# Patient Record
Sex: Female | Born: 1970 | Race: Black or African American | Hispanic: No | Smoking: Never smoker
Health system: Southern US, Community
[De-identification: ages and names within clinical notes are randomized; demographics above are authoritative.]

## PROBLEM LIST (undated history)

## (undated) ENCOUNTER — Emergency Department: Discharge status: Absent without leave | Payer: Self-pay

## (undated) DIAGNOSIS — R519 Headache, unspecified: Secondary | ICD-10-CM

## (undated) DIAGNOSIS — K219 Gastro-esophageal reflux disease without esophagitis: Secondary | ICD-10-CM

## (undated) DIAGNOSIS — R51 Headache: Secondary | ICD-10-CM

## (undated) DIAGNOSIS — R079 Chest pain, unspecified: Secondary | ICD-10-CM

## (undated) DIAGNOSIS — F419 Anxiety disorder, unspecified: Secondary | ICD-10-CM

## (undated) HISTORY — DX: Anxiety disorder, unspecified: F41.9

## (undated) HISTORY — PX: OTHER SURGICAL HISTORY: SHX169

---

## 2004-09-01 ENCOUNTER — Emergency Department: Payer: Self-pay | Admitting: Emergency Medicine

## 2005-02-14 ENCOUNTER — Emergency Department: Payer: Self-pay | Admitting: Emergency Medicine

## 2005-02-15 ENCOUNTER — Emergency Department: Payer: Self-pay | Admitting: Emergency Medicine

## 2006-12-21 ENCOUNTER — Encounter: Payer: Self-pay | Admitting: Maternal & Fetal Medicine

## 2007-02-22 ENCOUNTER — Encounter: Payer: Self-pay | Admitting: Maternal & Fetal Medicine

## 2007-03-22 ENCOUNTER — Encounter: Payer: Self-pay | Admitting: Maternal & Fetal Medicine

## 2010-08-28 ENCOUNTER — Ambulatory Visit: Payer: Self-pay | Admitting: Family Medicine

## 2010-11-03 HISTORY — PX: BREAST BIOPSY: SHX20

## 2012-11-16 ENCOUNTER — Observation Stay: Payer: Self-pay | Admitting: Internal Medicine

## 2012-11-16 LAB — CBC
HCT: 38.1 % (ref 35.0–47.0)
HGB: 12.8 g/dL (ref 12.0–16.0)
MCH: 31 pg (ref 26.0–34.0)
MCV: 92 fL (ref 80–100)
Platelet: 284 10*3/uL (ref 150–440)
RBC: 4.12 10*6/uL (ref 3.80–5.20)
RDW: 12.9 % (ref 11.5–14.5)
WBC: 6 10*3/uL (ref 3.6–11.0)

## 2012-11-16 LAB — COMPREHENSIVE METABOLIC PANEL
Albumin: 3.6 g/dL (ref 3.4–5.0)
Anion Gap: 8 (ref 7–16)
BUN: 10 mg/dL (ref 7–18)
Calcium, Total: 8.5 mg/dL (ref 8.5–10.1)
Creatinine: 0.68 mg/dL (ref 0.60–1.30)
EGFR (Non-African Amer.): >60
Glucose: 76 mg/dL (ref 65–99)
Osmolality: 273 (ref 275–301)
Potassium: 3.7 mmol/L (ref 3.5–5.1)
SGOT(AST): 38 U/L — ABNORMAL HIGH (ref 15–37)
SGPT (ALT): 68 U/L (ref 12–78)
Sodium: 138 mmol/L (ref 136–145)
Total Protein: 7.9 g/dL (ref 6.4–8.2)

## 2012-11-16 LAB — CK TOTAL AND CKMB (NOT AT ARMC)
CK, Total: 68 U/L (ref 21–215)
CK, Total: 51 U/L (ref 21–215)

## 2012-11-16 LAB — TROPONIN I
Troponin-I: <0.02 ng/mL
Troponin-I: <0.02 ng/mL

## 2012-11-16 LAB — URINALYSIS, COMPLETE
Blood: NEGATIVE
Leukocyte Esterase: NEGATIVE
Nitrite: NEGATIVE
RBC,UR: 2 /HPF (ref 0–5)
WBC UR: 3 /HPF (ref 0–5)

## 2012-11-16 LAB — HCG, QUANTITATIVE, PREGNANCY: Beta Hcg, Quant.: <1 m[IU]/mL — ABNORMAL LOW

## 2012-11-17 DIAGNOSIS — R079 Chest pain, unspecified: Secondary | ICD-10-CM

## 2012-11-17 LAB — LIPID PANEL
Cholesterol: 114 mg/dL (ref 0–200)
Ldl Cholesterol, Calc: 55 mg/dL (ref 0–100)
Triglycerides: 85 mg/dL (ref 0–200)
VLDL Cholesterol, Calc: 17 mg/dL (ref 5–40)

## 2012-11-17 LAB — TROPONIN I: Troponin-I: <0.02 ng/mL

## 2012-11-17 LAB — CK TOTAL AND CKMB (NOT AT ARMC): CK-MB: <0.5 ng/mL — ABNORMAL LOW (ref 0.5–3.6)

## 2012-12-14 ENCOUNTER — Ambulatory Visit: Payer: Self-pay | Admitting: Family Medicine

## 2013-11-03 HISTORY — PX: BREAST EXCISIONAL BIOPSY: SUR124

## 2014-06-15 ENCOUNTER — Ambulatory Visit: Payer: Self-pay | Admitting: Physician Assistant

## 2014-06-26 ENCOUNTER — Ambulatory Visit: Payer: Self-pay | Admitting: Physician Assistant

## 2014-07-13 ENCOUNTER — Encounter: Payer: Self-pay | Admitting: General Surgery

## 2014-07-13 ENCOUNTER — Ambulatory Visit (INDEPENDENT_AMBULATORY_CARE_PROVIDER_SITE_OTHER): Payer: Managed Care, Other (non HMO) | Admitting: General Surgery

## 2014-07-13 VITALS — BP 132/80 | HR 86 | Resp 12 | Ht 59.0 in | Wt 166.0 lb

## 2014-07-13 DIAGNOSIS — N63 Unspecified lump in unspecified breast: Secondary | ICD-10-CM

## 2014-07-13 NOTE — Patient Instructions (Addendum)
Patient to be scheduled for outpatient surgery. The patient is aware to call back for any questions or concerns.  This patient's surgery has been scheduled for 07-26-14 at [facility].

## 2014-07-13 NOTE — Progress Notes (Signed)
Patient ID: [name], female   DOB: September 01, 1971, 43 y.o.   MRN: [id number]  Chief Complaint  Patient presents with  . Other    abnormal mammogram    HPI [name] is a 43 y.o. female who presents for a breast evaluation. The most recent mammogram was done on 06/26/14. Patient does perform regular self breast checks. She denies any new problems with the breasts at this time. In 2012 she had core biopsy of a small left breast mass. Pathology showed benign adipose and breast tissue. Follow up was recommended then but patient did not return.    HPI  Past Medical History  Diagnosis Date  . Anxiety     Past Surgical History  Procedure Laterality Date  . Cesarean section  [id number]  . Breast biopsy Left 2012    Family History  Problem Relation Age of Onset  . Cancer Cousin     breast - 2nd cousin    Social History History  Substance Use Topics  . Smoking status: Never Smoker   . Smokeless tobacco: Never Used  . Alcohol Use: Yes    No Known Allergies  Current Outpatient Prescriptions  Medication Sig Dispense Refill  . ALYACEN 7/7/7 0.5/0.75/1-35 MG-MCG tablet Take 1 tablet by mouth daily.      . clonazePAM (KLONOPIN) 0.5 MG tablet Take 0.5 mg by mouth 2 (two) times daily as needed for anxiety.      Marland Kitchen ibuprofen (ADVIL,MOTRIN) 200 MG tablet Take 200 mg by mouth every 6 (six) hours as needed.       No current facility-administered medications for this visit.    Review of Systems Review of Systems  Constitutional: Negative.   Respiratory: Negative.   Cardiovascular: Negative.     Blood pressure 132/80, pulse 86, resp. rate 12, height 4\' 11"  (1.499 m), weight 166 lb (75.297 kg), last menstrual period 06/13/2014.  Physical Exam Physical Exam  Constitutional: She is oriented to person, place, and time. She appears well-developed and well-nourished.  Eyes: Conjunctivae are normal. No scleral icterus.  Neck: Neck supple. No thyromegaly present.  Cardiovascular:  Normal rate, regular rhythm and normal heart sounds.   No murmur heard. Pulmonary/Chest: Effort normal and breath sounds normal. Right breast exhibits no inverted nipple, no mass, no nipple discharge, no skin change and no tenderness. Left breast exhibits mass. Left breast exhibits no inverted nipple, no nipple discharge, no skin change and no tenderness.  Left breast 2 cm smooth mass located 3 o'clock just outside of areolar margin.   Lymphadenopathy:    She has no cervical adenopathy.    She has no axillary adenopathy.  Neurological: She is alert and oriented to person, place, and time.  Skin: Skin is warm and dry.    Data Reviewed Mammogram reviewed. Nodule is seen left breast 3 o'clock with clip in place. Ultrasound shows a well defined mass now measuring 1.9 cm. Previous measurement was 0.85 cm.   Assessment    Enlarging left breast nodule. Recommended excision. Discussed fully with the patient and she is agreeable.     Plan    Excision left breast mass in same day surgery.     Patient's surgery has been scheduled for 07-26-14 at [facility].   [name] 07/13/2014, 1:43 PM

## 2014-07-17 ENCOUNTER — Other Ambulatory Visit: Payer: Self-pay | Admitting: General Surgery

## 2014-07-17 DIAGNOSIS — N63 Unspecified lump in unspecified breast: Secondary | ICD-10-CM

## 2014-07-25 ENCOUNTER — Telehealth: Payer: Self-pay | Admitting: *Deleted

## 2014-07-25 NOTE — Telephone Encounter (Signed)
Pt called our office this morning stating that she is to have surgery tomorrow and was concerned about getting a flu shot today. I spoke with Dr. Jamal Collin about pt concern and he advised that as long as she has not had any reactions in the past where she was sick for 2-3 days then it would not cause a problem with having surgery tomorrow as scheduled. I spoke with and explained this to her. She verbalized her understanding but was still unsure about taking the flu shot. I suggested that if it was not necessary to have the shot today and she would feel more comfortable waiting then she had that option also. She verbalized understanding of these options and thanked me.

## 2014-07-26 ENCOUNTER — Ambulatory Visit: Payer: Self-pay | Admitting: General Surgery

## 2014-07-26 DIAGNOSIS — D249 Benign neoplasm of unspecified breast: Secondary | ICD-10-CM

## 2014-07-27 LAB — PATHOLOGY REPORT

## 2014-07-28 ENCOUNTER — Encounter: Payer: Self-pay | Admitting: General Surgery

## 2014-07-31 ENCOUNTER — Telehealth: Payer: Self-pay | Admitting: *Deleted

## 2014-07-31 ENCOUNTER — Encounter: Payer: Self-pay | Admitting: General Surgery

## 2014-07-31 NOTE — Telephone Encounter (Signed)
Patient was contacted today to make her aware that pathology report came back okay. She was instructed to keep follow up appointment as scheduled for 08-07-14. This patient verbalizes understanding.

## 2014-08-07 ENCOUNTER — Ambulatory Visit (INDEPENDENT_AMBULATORY_CARE_PROVIDER_SITE_OTHER): Payer: Self-pay | Admitting: General Surgery

## 2014-08-07 ENCOUNTER — Encounter: Payer: Self-pay | Admitting: General Surgery

## 2014-08-07 VITALS — BP 128/74 | HR 78 | Resp 12 | Ht 59.0 in | Wt 167.0 lb

## 2014-08-07 DIAGNOSIS — D242 Benign neoplasm of left breast: Secondary | ICD-10-CM

## 2014-08-07 NOTE — Progress Notes (Signed)
This is a 43 year old female here today for her post op left breast mass excision done on 07/31/14. Patient states she is doing well.  Incision looks clean and healing well.  Path- fibroadenoma. Pt advised. Patient to return in one year bilateral screening mammogram.

## 2014-08-07 NOTE — Patient Instructions (Signed)
Patient to return in one year bilateral screening mammogram. 

## 2014-09-04 ENCOUNTER — Encounter: Payer: Self-pay | Admitting: General Surgery

## 2015-02-23 NOTE — Discharge Summary (Signed)
PATIENT NAME:  [name], [name] MR#:  [id number] DATE OF BIRTH:  23-Dec-1970  DATE OF ADMISSION:  11/16/2012 DATE OF DISCHARGE:  11/17/2012  PRIMARY CARE PHYSICIAN: Rodman Key, MD    CHIEF COMPLAINT: Chest pain.   DISCHARGE DIAGNOSES: 1. Chest pain, appears noncardiac.  2. Syncopal episode without injury.  3. Anxiety, stress.  4. Myoview stress test negative.   VITALS: Stable on discharge.   MEDICATIONS:  1. Klonopin 0.5 mg 1 tablet daily.  2. Ibuprofen 600 mg 1 tablet every 8 hours as needed.   DIET: Regular.   FOLLOWUP:  Follow up with Dr. Gigi Gin in 1 to 2 weeks.   LABORATORY, DIAGNOSTIC AND RADIOLOGICAL DATA:  Myoview stress test showed ejection fraction of 64%. No focal wall motion abnormality noted. Echo within normal limits. Lipid profile within normal limits. Cardiac enzymes x 3 negative. Urinalysis negative for urinary tract infection. Comprehensive metabolic panel within normal limits. CBC within normal limits. D-dimer is 0.42.   Thoracic AP spine: No acute thoracic abnormality.  Chest x-ray: No acute abnormality.  CT of the head: No acute abnormality.   BRIEF SUMMARY OF HOSPITAL COURSE: The patient is a 44 year old female with no significant past medical history, who comes to the Emergency Room with:  1. Chest pain: The patient was admitted on a telemetry floor.  Her 3 sets of cardiac enzymes were negative. Her chest pain was actually reproducible on palpation, appears a lot to do with her ongoing stress and anxiety at work. The patient admits she has been under stress for the last one year, lately more so at work. She remained in sinus rhythm on EKG and telemetry. Myoview results were negative. Blood pressure remained stable.  2. Syncope of unclear ecology: Could be related to her chest pain and stress. CT of the head was negative. EKG normal. Blood pressure was stable. The patient did not have any dizzy symptoms or any neuro deficits.  3. Anxiety: The patient has been  dealing with her anxiety for the past one year. She has been on Klonopin. We also discussed at length about her stress at work, and the patient understands her ongoing stress has been built up at work and is in agreement to looking for another job. She is agreeable to go home if her stress test was test negative.   Hospital stay otherwise remained stable.   CODE STATUS: The patient remained a FULL CODE.   TIME SPENT: 40 minutes.    ____________________________ Hart Rochester Posey Pronto, MD sap:cb D: 11/25/2012 14:15:40 ET T: 11/25/2012 15:05:57 ET JOB#: [id number]  cc: Orma Cheetham A. Posey Pronto, MD, <Dictator> Ilda Basset MD ELECTRONICALLY SIGNED 11/26/2012 21:31

## 2015-02-23 NOTE — H&P (Signed)
PATIENT NAME:  [name], KHAM MR#:  [id number] DATE OF BIRTH:  [birth year]  DATE OF ADMISSION:  11/16/2012  PRIMARY CARE PHYSICIAN: Helene Kelp A. Gigi Gin, MD  CHIEF COMPLAINT: Chest pain.   HISTORY OF PRESENT ILLNESS: This is a 44 year old female who presents with the above complaint. The patient states that for the past 2 days she has had constant chest pain, substernally located, radiating to the left shoulder. She says it is 10 out of 10. She received nitroglycerin in the ER, which has relieved her pain. She said it started yesterday while she was sitting at her desk and just has not let up. She thought it was indigestion so she went to [facility] to look for some medication for indigestion and she passed out at that time. EMS was called and she was brought here for further evaluation. Here, her telemetry and EKG are normal sinus rhythm. Her first set of troponins are negative and she is chest pain-free.   REVIEW OF SYSTEMS: CONSTITUTIONAL: No fever, fatigue, weakness. EYES: No blurred or double vision, glaucoma or cataracts.  ENT: No ear pain, hearing loss, seasonal allergies.  RESPIRATORY: Denies any cough, wheezing, hemoptysis, COPD.  CARDIOVASCULAR: Chest pain as mentioned above. No palpitations or orthopnea. Positive syncope. No edema.  GASTROINTESTINAL: No nausea, vomiting, diarrhea, abdominal pain, melena or ulcers.  GENITOURINARY: No dysuria.  ENDOCRINE: No polyuria or polydipsia.  HEMATOLOGIC, LYMPHATIC: No anemia or easy bruising.  SKIN: No rash or lesions.  MUSCULOSKELETAL: No limited activity.  NEUROLOGIC: No history of CVA, TIA, seizures.  PSYCHIATRIC: Positive anxiety.   PAST MEDICAL HISTORY: Anxiety.  MEDICATIONS: Clonazepam 0.5 mg q.5:00 p.m.   ALLERGIES: No known drug allergies.   PAST SURGICAL HISTORY: C-section.   SOCIAL HISTORY: No tobacco, alcohol or drug use.   FAMILY HISTORY: Positive for hypertension and diabetes.   PHYSICAL EXAMINATION:  VITAL SIGNS:  Temperature 98.6, pulse 79, respirations 20, blood pressure 123/72, 100% on room air.  GENERAL: The patient is alert, oriented, not in acute distress.  HEENT: Head is atraumatic. Pupils are round and reactive. Sclerae anicteric. Mucous membranes are moist. Oropharynx is clear.  NECK: Supple without JVD, carotid bruit or enlarged thyroid.  CARDIOVASCULAR: Regular rate and rhythm. No murmurs, gallops or rubs. PMI is not displaced.  LUNGS: Clear to auscultation bilaterally without crackles, rales, rhonchi or wheezing. Normal percussion.  ABDOMEN: Bowel sounds are positive. Nontender, nondistended. No hepatosplenomegaly.  EXTREMITIES: No clubbing, cyanosis or edema.  NEUROLOGIC: Cranial nerves II through XII are grossly intact. No focal deficits. SKIN: Without rash or lesions.  BACK: No CVA or vertebral tenderness.   LABORATORY, DIAGNOSTIC AND RADIOLOGICAL DATA: Sodium 138, potassium 3.7, chloride 108, bicarbonate 22, BUN 10, creatinine 0.68, glucose 76, calcium 8.5, bilirubin 0.5, alkaline phosphatase 62, ALT 68, AST 38, total protein 7.9, albumin 3.6. White blood cells 6.0, hemoglobin 12.8, hematocrit 38.1, platelets 284. Troponin less than 0.02. D-dimer 0.42. CT of the head without contrast shows no acute intracranial hemorrhage or CVA. Urinalysis shows no leukocyte esterase or nitrites. Chest x-ray shows no acute cardiopulmonary disease. Thoracic spine shows no acute fracture. EKG: Normal sinus rhythm. No ST elevations or depression.   ASSESSMENT AND PLAN: This is a 43 year old female who presents with chest pain and syncope. 1.  Chest pain: The patient will be admitted to telemetry. We will continue to cycle her cardiac enzymes. Her chest pain is actually reproducible on palpation. I do not know if this is musculoskeletal, it could gastrointestinal related or anxiety related  as well, if her Myoview is negative. So for tomorrow, we have ordered a Myoview. I have started her on a baby aspirin and will  follow her cardiac enzymes.  2.  Syncope of unclear etiology: Could be related to her chest pain. The patient's CT of the head was normal. Will go ahead and order an echocardiogram to further evaluate her cause of syncope and continue telemetry.  3.  Anxiety: We will continue her clonazepam.  4.  The patient is full code status.   TIME SPENT: Approximately 40 minutes.   ____________________________ Donell Beers. Benjie Karvonen, MD spm:jm D: 11/16/2012 15:57:03 ET T: 11/16/2012 16:54:28 ET JOB#: [id number]  cc: Manolito Jurewicz P. Benjie Karvonen, MD, <Dictator> Epimenio Foot. Gigi Gin, MD Donell Beers Tai Syfert MD ELECTRONICALLY SIGNED 11/16/2012 18:45

## 2015-02-24 NOTE — Op Note (Signed)
PATIENT NAME:  [name], [name] MR#:  [id number] DATE OF BIRTH:  1971/02/25  DATE OF PROCEDURE:  07/26/2014  PREOPERATIVE DIAGNOSIS: Left breast mass, enlarging in size.   POSTOPERATIVE DIAGNOSIS: Left breast mass, enlarging in size.   OPERATION: Excision left breast mass.   SURGEON: Mckinley Jewel, M.D.   ANESTHESIA: General.   COMPLICATIONS: None.   ESTIMATED BLOOD LOSS: Minimal.   DRAINS: None.   DESCRIPTION OF PROCEDURE: The patient was put to sleep with a LMA. The left breast was prepped and draped out as a sterile field. Located just lateral to the areola margin was a palpable 2 cm mass. A local anesthetic of 0.5% Marcaine was instilled and a circumareolar incision was made along the lateral aspect, and the skin and subcutaneous tissue were then elevated to reveal the glandular element. From here further dissection was performed to find a fairly well circumscribed, approximately 2 cm sized mass, which was excised with a rim of normal tissue surrounding this. Bleeding was controlled with cautery. The deeper tissue was closed with 2-0 Vicryl and the skin closed with subcuticular 4-0 Vicryl, covered with Dermabond. The procedure was well tolerated. She was subsequently returned to the recovery room in stable condition.  ____________________________ S.Robinette Haines, MD sgs:sb D: 07/31/2014 08:48:38 ET T: 07/31/2014 09:19:54 ET JOB#: [id number]  cc: Synthia Innocent. Jamal Collin, MD, <Dictator> [facility] Robinette Haines MD ELECTRONICALLY SIGNED 07/31/2014 14:06

## 2015-04-09 NOTE — H&P (Signed)
[name]  DICTATION # [id number] CSN# [id number]   Margarette Asal, MD 04/09/2015 3:00 PM

## 2015-04-11 NOTE — H&P (Signed)
[name], [name] NO.:  [id number]  MEDICAL RECORD NO.:  [id number]  LOCATION:                               FACILITY:  [facility]  PHYSICIAN:  Ralene Bathe. Matthew Saras, M.D.DATE OF BIRTH:  May 10, 1971  DATE OF CONSULTATION:  04/24/2015 DATE OF DISCHARGE:                                CONSULTATION   CHIEF COMPLAINT:  Symptomatic fibroids.  HISTORY OF PRESENT ILLNESS:  A 44 year old, G3, P2, currently on OCPs, was evaluated originally in May of this year by Dr. Carren Rang.  Weight 167 pounds.  Ultrasound performed on Mar 07, 2015, demonstrated multiple fibroids 3.7, 1.9, 3.5, 4.6 x 3.2 with some areas of degeneration.  No free fluid was noted.  She presents at this time for TAH with bilateral salpingectomy.  This procedure including specific risks related to bleeding, infection, transfusion of adjacent organ injury, the rationale for salpingectomy are reviewed.  Other risks related to wound infection, phlebitis, and her expected recovery time discussed, which she understands and accepts.  Pap in May 2016 was normal.  PAST MEDICAL HISTORY:  None.  ALLERGIES:  None.  CURRENT MEDICATION:  Clonazepam p.r.n. and OCPs.  REVIEW OF SYSTEMS:  Significant for migraine and UTI.  FAMILY HISTORY:  Significant for diverticulosis, diabetes, hypertension, and an unspecified cancer.  SURGICAL HISTORY:  Two prior cesarean sections.  She has also had a lumpectomy in September 2015.  SOCIAL HISTORY:  Denies drug or tobacco use.  She does have a drink, 1-2 drinks per week.  She is divorced.  Dr. Wallie Char is her medical doctor.  PHYSICAL EXAM:  VITAL SIGNS:  Temperature 98.2 and blood pressure 124/88. HEENT:  Unremarkable. NECK:  Supple without masses. LUNGS:  Clear. CARDIOVASCULAR:  Regular rate and rhythm without murmurs, rubs, or gallops noted. BREASTS:  Without masses. ABDOMEN:  Soft, flat, and nontender.  Vulva, vagina, and cervix were normal.  Uterus was 14-week size.   Adnexa negative. EXTREMITIES:  Unremarkable. NEUROLOGIC:  Unremarkable.  IMPRESSION:  Symptomatic fibroids.  PLAN:  TAH and bilateral salpingectomies.     Terryann Verbeek M. Matthew Saras, M.D.     [facility]/MEDQ  D:  04/09/2015  T:  04/10/2015  Job:  [id number]

## 2015-04-13 NOTE — Progress Notes (Signed)
Please put orders in Epic surgery 04-24-15 pre op 04-23-15 Thanks

## 2015-04-16 NOTE — Progress Notes (Signed)
Called Dr. Delanna Ahmadi office about orders spoke with hs scheduler [place].  Will send a message to put orders in for 04-20-15 PAT appointment.

## 2015-04-18 NOTE — Patient Instructions (Addendum)
[name]  04/18/2015   Your procedure is scheduled on: 04-24-15  Report to [facility] Main  Entrance and follow signs to               [place] at  5:30  AM.  Call this number if you have problems the morning of surgery [phone]   Remember: ONLY 1 PERSON MAY GO WITH YOU TO SHORT STAY TO GET  READY MORNING OF [place].  Do not eat food or drink liquids :After Midnight.     Take these medicines the morning of surgery with A SIP OF WATER: Zyrtec, Klonopin if needed, Voltaren                  [name]  04/20/2015   Your procedure is scheduled on:   Report to [place]  Entrance and follow signs to               [place] at AM.  Call this number if you have problems the morning of surgery [phone]   Remember: ONLY 1 PERSON MAY GO WITH YOU TO SHORT STAY TO GET  READY MORNING OF [place].  Do not eat food or drink liquids :After Midnight.     Take these medicines the morning of surgery with A SIP OF WATER:                                You may not have any metal on your body including hair pins and              piercings  Do not wear jewelry, make-up, lotions, powders or perfumes, deodorant             Do not wear nail polish.  Do not shave  48 hours prior to surgery.              Men may shave face and neck.   Do not bring valuables to the hospital. [place].  Contacts, dentures or bridgework may not be worn into surgery.  Leave suitcase in the car. After surgery it may be brought to your room.     Patients discharged the day of surgery will not be allowed to drive home.  Name and phone number of your driver:  Special Instructions: N/A              Please read over the following fact sheets you were given: _____________________________________________________________________              WHAT IS A BLOOD TRANSFUSION? Blood Transfusion  Information  A transfusion is the replacement of blood or some of its parts. Blood is made up of multiple cells which provide different functions.  Red blood cells carry oxygen and are used for blood loss replacement.  White blood cells fight against infection.  Platelets control bleeding.  Plasma helps clot blood.  Other blood products are available for specialized needs, such as hemophilia or other clotting disorders. BEFORE THE TRANSFUSION  Who gives blood for transfusions?   Healthy volunteers who are fully evaluated to make sure their blood is safe. This is blood bank blood. Transfusion therapy is the safest it has ever been  in the practice of medicine. Before blood is taken from a donor, a complete history is taken to make sure that person has no history of diseases nor engages in risky social behavior (examples are intravenous drug use or sexual activity with multiple partners). The donor's travel history is screened to minimize risk of transmitting infections, such as malaria. The donated blood is tested for signs of infectious diseases, such as HIV and hepatitis. The blood is then tested to be sure it is compatible with you in order to minimize the chance of a transfusion reaction. If you or a relative donates blood, this is often done in anticipation of surgery and is not appropriate for emergency situations. It takes many days to process the donated blood. RISKS AND COMPLICATIONS Although transfusion therapy is very safe and saves many lives, the main dangers of transfusion include:   Getting an infectious disease.  Developing a transfusion reaction. This is an allergic reaction to something in the blood you were given. Every precaution is taken to prevent this. The decision to have a blood transfusion has been considered carefully by your caregiver before blood is given. Blood is not given unless the benefits outweigh the risks. AFTER THE TRANSFUSION  Right after receiving a  blood transfusion, you will usually feel much better and more energetic. This is especially true if your red blood cells have gotten low (anemic). The transfusion raises the level of the red blood cells which carry oxygen, and this usually causes an energy increase.  The nurse administering the transfusion will monitor you carefully for complications. HOME CARE INSTRUCTIONS  No special instructions are needed after a transfusion. You may find your energy is better. Speak with your caregiver about any limitations on activity for underlying diseases you may have. SEEK MEDICAL CARE IF:   Your condition is not improving after your transfusion.  You develop redness or irritation at the intravenous (IV) site. SEEK IMMEDIATE MEDICAL CARE IF:  Any of the following symptoms occur over the next 12 hours:  Shaking chills.  You have a temperature by mouth above 102 F (38.9 C), not controlled by medicine.  Chest, back, or muscle pain.  People around you feel you are not acting correctly or are confused.  Shortness of breath or difficulty breathing.  Dizziness and fainting.  You get a rash or develop hives.  You have a decrease in urine output.  Your urine turns a dark color or changes to pink, red, or brown. Any of the following symptoms occur over the next 10 days:  You have a temperature by mouth above 102 F (38.9 C), not controlled by medicine.  Shortness of breath.  Weakness after normal activity.  The white part of the eye turns yellow (jaundice).  You have a decrease in the amount of urine or are urinating less often.  Your urine turns a dark color or changes to pink, red, or brown. Document Released: 10/17/2000 Document Revised: 01/12/2012 Document Reviewed: 06/05/2008 ExitCare Patient Information 2014 ExitCare, [place].  _______________________________________________________________________                               [name] may not have any metal on your body including  hair pins and              piercings  Do not wear jewelry, make-up, lotions, powders or perfumes, deodorant             Do  not wear nail polish.  Do not shave  48 hours prior to surgery.               Do not bring valuables to the hospital. [place].  Contacts, dentures or bridgework may not be worn into surgery.  Leave suitcase in the car. After surgery it may be brought to your room.    . :  Special Instructions: coughing and deep breathing exercises, leg exercises              Please read over the following fact sheets you were given: _____________________________________________________________________             [facility] - Preparing for Surgery Before surgery, you can play an important role.  Because skin is not sterile, your skin needs to be as free of germs as possible.  You can reduce the number of germs on your skin by washing with CHG (chlorahexidine gluconate) soap before surgery.  CHG is an antiseptic cleaner which kills germs and bonds with the skin to continue killing germs even after washing. Please DO NOT use if you have an allergy to CHG or antibacterial soaps.  If your skin becomes reddened/irritated stop using the CHG and inform your nurse when you arrive at Short Stay. Do not shave (including legs and underarms) for at least 48 hours prior to the first CHG shower.  You may shave your face/neck. Please follow these instructions carefully:  1.  Shower with CHG Soap the night before surgery and the  morning of Surgery.  2.  If you choose to wash your hair, wash your hair first as usual with your  normal  shampoo.  3.  After you shampoo, rinse your hair and body thoroughly to remove the  shampoo.                           4.  Use CHG as you would any other liquid soap.  You can apply chg directly  to the skin and wash                       Gently with a scrungie or clean washcloth.  5.  Apply the CHG Soap to your body  ONLY FROM THE NECK DOWN.   Do not use on face/ open                           Wound or open sores. Avoid contact with eyes, ears mouth and genitals (private parts).                       Wash face,  Genitals (private parts) with your normal soap.             6.  Wash thoroughly, paying special attention to the area where your surgery  will be performed.  7.  Thoroughly rinse your body with warm water from the neck down.  8.  DO NOT shower/wash with your normal soap after using and rinsing off  the CHG Soap.                9.  Pat yourself dry with a clean towel.            10.  Wear clean pajamas.  11.  Place clean sheets on your bed the night of your first shower and do not  sleep with pets. Day of Surgery : Do not apply any lotions/deodorants the morning of surgery.  Please wear clean clothes to the hospital/surgery center.  FAILURE TO FOLLOW THESE INSTRUCTIONS MAY RESULT IN THE CANCELLATION OF YOUR SURGERY PATIENT SIGNATURE_________________________________  NURSE SIGNATURE__________________________________  ________________________________________________________________________

## 2015-04-20 ENCOUNTER — Encounter (HOSPITAL_COMMUNITY): Payer: Self-pay

## 2015-04-20 ENCOUNTER — Encounter (HOSPITAL_COMMUNITY)
Admission: RE | Admit: 2015-04-20 | Discharge: 2015-04-20 | Disposition: A | Discharge status: Home / Self Care | Payer: Managed Care, Other (non HMO) | Source: Ambulatory Visit | Attending: Obstetrics and Gynecology | Admitting: Obstetrics and Gynecology

## 2015-04-20 DIAGNOSIS — R102 Pelvic and perineal pain: Secondary | ICD-10-CM | POA: Diagnosis not present

## 2015-04-20 DIAGNOSIS — N920 Excessive and frequent menstruation with regular cycle: Secondary | ICD-10-CM | POA: Diagnosis not present

## 2015-04-20 DIAGNOSIS — D259 Leiomyoma of uterus, unspecified: Secondary | ICD-10-CM | POA: Diagnosis not present

## 2015-04-20 DIAGNOSIS — Z01818 Encounter for other preprocedural examination: Secondary | ICD-10-CM | POA: Insufficient documentation

## 2015-04-20 HISTORY — DX: Headache: R51

## 2015-04-20 HISTORY — DX: Chest pain, unspecified: R07.9

## 2015-04-20 HISTORY — DX: Gastro-esophageal reflux disease without esophagitis: K21.9

## 2015-04-20 HISTORY — DX: Headache, unspecified: R51.9

## 2015-04-20 LAB — HCG, SERUM, QUALITATIVE: PREG SERUM: NEGATIVE

## 2015-04-20 LAB — BASIC METABOLIC PANEL
ANION GAP: 7 (ref 5–15)
BUN: 13 mg/dL (ref 6–20)
CHLORIDE: 108 mmol/L (ref 101–111)
CO2: 28 mmol/L (ref 22–32)
Calcium: 9.9 mg/dL (ref 8.9–10.3)
Creatinine, Ser: 0.68 mg/dL (ref 0.44–1.00)
GFR calc Af Amer: >60 mL/min (ref >60)
GFR calc non Af Amer: >60 mL/min (ref >60)
Glucose, Bld: 101 mg/dL — ABNORMAL HIGH (ref 65–99)
POTASSIUM: 4.2 mmol/L (ref 3.5–5.1)
SODIUM: 143 mmol/L (ref 135–145)

## 2015-04-20 LAB — CBC
HCT: 40.1 % (ref 36.0–46.0)
Hemoglobin: 13.2 g/dL (ref 12.0–15.0)
MCH: 30.5 pg (ref 26.0–34.0)
MCHC: 32.9 g/dL (ref 30.0–36.0)
MCV: 92.6 fL (ref 78.0–100.0)
Platelets: 299 10*3/uL (ref 150–400)
RBC: 4.33 MIL/uL (ref 3.87–5.11)
RDW: 11.9 % (ref 11.5–15.5)
WBC: 3.9 10*3/uL — ABNORMAL LOW (ref 4.0–10.5)

## 2015-04-20 LAB — ABO/RH: ABO/RH(D): AB POS

## 2015-04-20 NOTE — Progress Notes (Signed)
Stress Test - 11-17-12 in chart EKG - 11-16-12 in chart

## 2015-04-20 NOTE — Progress Notes (Signed)
04-20-15 Negative serum pregnancy test at preop visit

## 2015-04-20 NOTE — Progress Notes (Addendum)
04-20-15 at preop visit, no orders.  Pt. Instructed to call surgeon's office regarding any bowel prep instructions 04-20-15 called Dr. Toma Deiters office to advise no orders at preop appt.  Heather stated Dr. Matthew Saras tried to put them in earlier.  Orders did not show up in EPIC at [facility] for surgery on 04-24-15

## 2015-04-23 ENCOUNTER — Other Ambulatory Visit (HOSPITAL_COMMUNITY): Payer: Managed Care, Other (non HMO)

## 2015-04-23 NOTE — Progress Notes (Addendum)
04-23-15 - Dr. Matthew Saras advised to correct abbreviations in orders for surgery concent via fax EPIC

## 2015-04-23 NOTE — Progress Notes (Signed)
04-23-15  Abbreviations in orders for surgical consent.  Please correct.

## 2015-04-23 NOTE — Anesthesia Preprocedure Evaluation (Addendum)
Anesthesia Evaluation  Patient identified by MRN, date of birth, ID band Patient awake    Reviewed: Allergy & Precautions, NPO status , Patient's Chart, lab work & pertinent test results  Airway Mallampati: II  TM Distance: >3 FB Neck ROM: Full    Dental no notable dental hx.    Pulmonary neg pulmonary ROS,  breath sounds clear to auscultation  Pulmonary exam normal       Cardiovascular negative cardio ROS Normal cardiovascular examRhythm:Regular Rate:Normal     Neuro/Psych Anxiety negative neurological ROS     GI/Hepatic Neg liver ROS, GERD-  Medicated,  Endo/Other  negative endocrine ROS  Renal/GU negative Renal ROS  negative genitourinary   Musculoskeletal negative musculoskeletal ROS (+)   Abdominal   Peds negative pediatric ROS (+)  Hematology negative hematology ROS (+)   Anesthesia Other Findings   Reproductive/Obstetrics negative OB ROS                            Anesthesia Physical Anesthesia Plan  ASA: II  Anesthesia Plan: General   Post-op Pain Management:    Induction: Intravenous  Airway Management Planned: Oral ETT  Additional Equipment:   Intra-op Plan:   Post-operative Plan: Extubation in OR  Informed Consent: I have reviewed the patients History and Physical, chart, labs and discussed the procedure including the risks, benefits and alternatives for the proposed anesthesia with the patient or authorized representative who has indicated his/her understanding and acceptance.   Dental advisory given  Plan Discussed with: CRNA and Surgeon  Anesthesia Plan Comments:         Anesthesia Quick Evaluation

## 2015-04-24 ENCOUNTER — Encounter (HOSPITAL_COMMUNITY)
Admission: RE | Disposition: A | Discharge status: Home / Self Care | Payer: Self-pay | Source: Ambulatory Visit | Attending: Obstetrics and Gynecology

## 2015-04-24 ENCOUNTER — Inpatient Hospital Stay (HOSPITAL_COMMUNITY)
Admission: RE | Admit: 2015-04-24 | Discharge: 2015-04-26 | DRG: 743 | Disposition: A | Discharge status: Home / Self Care | Payer: Managed Care, Other (non HMO) | Source: Ambulatory Visit | Attending: Obstetrics and Gynecology | Admitting: Obstetrics and Gynecology

## 2015-04-24 ENCOUNTER — Encounter (HOSPITAL_COMMUNITY): Payer: Self-pay | Admitting: Certified Registered Nurse Anesthetist

## 2015-04-24 ENCOUNTER — Inpatient Hospital Stay (HOSPITAL_COMMUNITY): Payer: Managed Care, Other (non HMO) | Admitting: Anesthesiology

## 2015-04-24 DIAGNOSIS — Z87440 Personal history of urinary (tract) infections: Secondary | ICD-10-CM

## 2015-04-24 DIAGNOSIS — D219 Benign neoplasm of connective and other soft tissue, unspecified: Secondary | ICD-10-CM | POA: Diagnosis present

## 2015-04-24 DIAGNOSIS — G43909 Migraine, unspecified, not intractable, without status migrainosus: Secondary | ICD-10-CM | POA: Diagnosis present

## 2015-04-24 DIAGNOSIS — D259 Leiomyoma of uterus, unspecified: Principal | ICD-10-CM | POA: Diagnosis present

## 2015-04-24 HISTORY — PX: ABDOMINAL HYSTERECTOMY: SHX81

## 2015-04-24 LAB — TYPE AND SCREEN
ABO/RH(D): AB POS
ANTIBODY SCREEN: NEGATIVE

## 2015-04-24 SURGERY — HYSTERECTOMY, ABDOMINAL
Anesthesia: General | Site: Abdomen

## 2015-04-24 MED ORDER — HYDROMORPHONE HCL 2 MG/ML IJ SOLN
INTRAMUSCULAR | Status: AC
  Filled 2015-04-24: qty 1

## 2015-04-24 MED ORDER — MORPHINE SULFATE 1 MG/ML IV SOLN
INTRAVENOUS | Status: AC
  Filled 2015-04-24: qty 25

## 2015-04-24 MED ORDER — IBUPROFEN 800 MG PO TABS: 800.0000 mg | ORAL_TABLET | Freq: Three times a day (TID) | ORAL | Status: DC | PRN

## 2015-04-24 MED ORDER — GLYCOPYRROLATE 0.2 MG/ML IJ SOLN
INTRAMUSCULAR | Status: AC
Start: 2015-04-24 — End: 2015-04-24
  Filled 2015-04-24: qty 3

## 2015-04-24 MED ORDER — KETOROLAC TROMETHAMINE 30 MG/ML IJ SOLN
INTRAMUSCULAR | Status: AC
  Filled 2015-04-24: qty 1

## 2015-04-24 MED ORDER — MIDAZOLAM HCL 2 MG/2ML IJ SOLN
INTRAMUSCULAR | Status: AC
  Filled 2015-04-24: qty 2

## 2015-04-24 MED ORDER — OXYCODONE-ACETAMINOPHEN 5-325 MG PO TABS
1.0000 | ORAL_TABLET | ORAL | Status: DC | PRN
  Administered 2015-04-25 – 2015-04-26 (×7): 1 via ORAL
  Filled 2015-04-24 (×4): qty 1
  Filled 2015-04-24: qty 2
  Filled 2015-04-24: qty 1
  Filled 2015-04-24: qty 2

## 2015-04-24 MED ORDER — DEXAMETHASONE SODIUM PHOSPHATE 10 MG/ML IJ SOLN
INTRAMUSCULAR | Status: AC
  Filled 2015-04-24: qty 1

## 2015-04-24 MED ORDER — FENTANYL CITRATE (PF) 100 MCG/2ML IJ SOLN
INTRAMUSCULAR | Status: DC | PRN
  Administered 2015-04-24 (×5): 50 ug via INTRAVENOUS

## 2015-04-24 MED ORDER — LIDOCAINE HCL (CARDIAC) 20 MG/ML IV SOLN
INTRAVENOUS | Status: AC
  Filled 2015-04-24: qty 5

## 2015-04-24 MED ORDER — FENTANYL CITRATE (PF) 250 MCG/5ML IJ SOLN
INTRAMUSCULAR | Status: AC
  Filled 2015-04-24: qty 5

## 2015-04-24 MED ORDER — EPHEDRINE SULFATE 50 MG/ML IJ SOLN
INTRAMUSCULAR | Status: AC
  Filled 2015-04-24: qty 1

## 2015-04-24 MED ORDER — NALOXONE HCL 0.4 MG/ML IJ SOLN: 0.4000 mg | INTRAMUSCULAR | Status: DC | PRN

## 2015-04-24 MED ORDER — MIDAZOLAM HCL 5 MG/5ML IJ SOLN
INTRAMUSCULAR | Status: DC | PRN
Start: 2015-04-24 — End: 2015-04-24
  Administered 2015-04-24: 2 mg via INTRAVENOUS

## 2015-04-24 MED ORDER — SUCCINYLCHOLINE CHLORIDE 20 MG/ML IJ SOLN
INTRAMUSCULAR | Status: DC | PRN
  Administered 2015-04-24: 100 mg via INTRAVENOUS

## 2015-04-24 MED ORDER — PROPOFOL 10 MG/ML IV BOLUS
INTRAVENOUS | Status: AC
  Filled 2015-04-24: qty 20

## 2015-04-24 MED ORDER — ONDANSETRON HCL 4 MG/2ML IJ SOLN
INTRAMUSCULAR | Status: AC
  Filled 2015-04-24: qty 2

## 2015-04-24 MED ORDER — BUPIVACAINE LIPOSOME 1.3 % IJ SUSP
20.0000 mL | Freq: Once | INTRAMUSCULAR | Status: AC
  Administered 2015-04-24: 20 mL
  Filled 2015-04-24: qty 20

## 2015-04-24 MED ORDER — ROCURONIUM BROMIDE 100 MG/10ML IV SOLN
INTRAVENOUS | Status: DC | PRN
  Administered 2015-04-24: 40 mg via INTRAVENOUS

## 2015-04-24 MED ORDER — DEXTROSE IN LACTATED RINGERS 5 % IV SOLN
INTRAVENOUS | Status: DC
  Administered 2015-04-24 – 2015-04-25 (×3): via INTRAVENOUS

## 2015-04-24 MED ORDER — KETOROLAC TROMETHAMINE 30 MG/ML IJ SOLN
INTRAMUSCULAR | Status: DC | PRN
  Administered 2015-04-24: 30 mg via INTRAVENOUS

## 2015-04-24 MED ORDER — HYDROMORPHONE HCL 1 MG/ML IJ SOLN
INTRAMUSCULAR | Status: AC
  Filled 2015-04-24: qty 1

## 2015-04-24 MED ORDER — SODIUM CHLORIDE 0.9 % IJ SOLN
INTRAMUSCULAR | Status: AC
  Filled 2015-04-24: qty 10

## 2015-04-24 MED ORDER — DIPHENHYDRAMINE HCL 12.5 MG/5ML PO ELIX: 12.5000 mg | ORAL_SOLUTION | Freq: Four times a day (QID) | ORAL | Status: DC | PRN

## 2015-04-24 MED ORDER — MORPHINE SULFATE 1 MG/ML IV SOLN
INTRAVENOUS | Status: DC
  Administered 2015-04-24: 7 mg via INTRAVENOUS
  Administered 2015-04-24: 09:00:00 via INTRAVENOUS
  Administered 2015-04-24: 1 mg via INTRAVENOUS
  Administered 2015-04-24: 2 mg via INTRAVENOUS
  Administered 2015-04-25: 1 mg via INTRAVENOUS
  Administered 2015-04-25 (×2): 0 mg via INTRAVENOUS
  Administered 2015-04-25: 1 mg via INTRAVENOUS

## 2015-04-24 MED ORDER — ONDANSETRON HCL 4 MG PO TABS
4.0000 mg | ORAL_TABLET | Freq: Four times a day (QID) | ORAL | Status: DC | PRN
  Administered 2015-04-26: 4 mg via ORAL
  Filled 2015-04-24: qty 1

## 2015-04-24 MED ORDER — BUTORPHANOL TARTRATE 2 MG/ML IJ SOLN: 1.0000 mg | INTRAMUSCULAR | Status: DC | PRN

## 2015-04-24 MED ORDER — CEFOTETAN DISODIUM-DEXTROSE 2-2.08 GM-% IV SOLR
INTRAVENOUS | Status: AC
  Filled 2015-04-24: qty 50

## 2015-04-24 MED ORDER — METOCLOPRAMIDE HCL 5 MG/ML IJ SOLN
INTRAMUSCULAR | Status: AC
  Filled 2015-04-24: qty 2

## 2015-04-24 MED ORDER — ONDANSETRON HCL 4 MG/2ML IJ SOLN
4.0000 mg | Freq: Four times a day (QID) | INTRAMUSCULAR | Status: DC | PRN
  Filled 2015-04-24 (×2): qty 2

## 2015-04-24 MED ORDER — HYDROMORPHONE HCL 1 MG/ML IJ SOLN
INTRAMUSCULAR | Status: DC | PRN
  Administered 2015-04-24 (×4): 0.5 mg via INTRAVENOUS

## 2015-04-24 MED ORDER — PROMETHAZINE HCL 25 MG/ML IJ SOLN: 6.2500 mg | INTRAMUSCULAR | Status: DC | PRN

## 2015-04-24 MED ORDER — DEXAMETHASONE SODIUM PHOSPHATE 10 MG/ML IJ SOLN
INTRAMUSCULAR | Status: DC | PRN
  Administered 2015-04-24: 10 mg via INTRAVENOUS

## 2015-04-24 MED ORDER — DIPHENHYDRAMINE HCL 50 MG/ML IJ SOLN: 12.5000 mg | Freq: Four times a day (QID) | INTRAMUSCULAR | Status: DC | PRN

## 2015-04-24 MED ORDER — NEOSTIGMINE METHYLSULFATE 10 MG/10ML IV SOLN
INTRAVENOUS | Status: DC | PRN
  Administered 2015-04-24: 4 mg via INTRAVENOUS

## 2015-04-24 MED ORDER — LACTATED RINGERS IV SOLN
INTRAVENOUS | Status: DC | PRN
  Administered 2015-04-24 (×2): via INTRAVENOUS

## 2015-04-24 MED ORDER — PROPOFOL 10 MG/ML IV BOLUS
INTRAVENOUS | Status: DC | PRN
  Administered 2015-04-24: 150 mg via INTRAVENOUS

## 2015-04-24 MED ORDER — ONDANSETRON HCL 4 MG/2ML IJ SOLN
INTRAMUSCULAR | Status: DC | PRN
  Administered 2015-04-24: 4 mg via INTRAVENOUS

## 2015-04-24 MED ORDER — ONDANSETRON HCL 4 MG/2ML IJ SOLN
4.0000 mg | Freq: Four times a day (QID) | INTRAMUSCULAR | Status: DC | PRN
  Administered 2015-04-24 (×2): 4 mg via INTRAVENOUS

## 2015-04-24 MED ORDER — ROCURONIUM BROMIDE 100 MG/10ML IV SOLN
INTRAVENOUS | Status: AC
  Filled 2015-04-24: qty 1

## 2015-04-24 MED ORDER — LIDOCAINE HCL (CARDIAC) 20 MG/ML IV SOLN
INTRAVENOUS | Status: DC | PRN
  Administered 2015-04-24: 10 mg via INTRAVENOUS

## 2015-04-24 MED ORDER — KETOROLAC TROMETHAMINE 30 MG/ML IJ SOLN
30.0000 mg | Freq: Four times a day (QID) | INTRAMUSCULAR | Status: DC
  Filled 2015-04-24 (×8): qty 1

## 2015-04-24 MED ORDER — CEFOTETAN DISODIUM-DEXTROSE 2-2.08 GM-% IV SOLR
2.0000 g | INTRAVENOUS | Status: AC
  Administered 2015-04-24: 2 g via INTRAVENOUS

## 2015-04-24 MED ORDER — MENTHOL 3 MG MT LOZG
1.0000 | LOZENGE | OROMUCOSAL | Status: DC | PRN
  Administered 2015-04-26: 3 mg via ORAL
  Filled 2015-04-24 (×2): qty 9

## 2015-04-24 MED ORDER — METOCLOPRAMIDE HCL 5 MG/ML IJ SOLN
INTRAMUSCULAR | Status: DC | PRN
  Administered 2015-04-24: 10 mg via INTRAVENOUS

## 2015-04-24 MED ORDER — KETOROLAC TROMETHAMINE 30 MG/ML IJ SOLN
30.0000 mg | Freq: Once | INTRAMUSCULAR | Status: AC
  Administered 2015-04-24: 30 mg via INTRAVENOUS

## 2015-04-24 MED ORDER — NEOSTIGMINE METHYLSULFATE 10 MG/10ML IV SOLN
INTRAVENOUS | Status: AC
  Filled 2015-04-24: qty 1

## 2015-04-24 MED ORDER — SODIUM CHLORIDE 0.9 % IJ SOLN: 9.0000 mL | INTRAMUSCULAR | Status: DC | PRN

## 2015-04-24 MED ORDER — SODIUM CHLORIDE 0.9 % IJ SOLN
INTRAMUSCULAR | Status: AC
  Filled 2015-04-24: qty 50

## 2015-04-24 MED ORDER — KETOROLAC TROMETHAMINE 30 MG/ML IJ SOLN
30.0000 mg | Freq: Four times a day (QID) | INTRAMUSCULAR | Status: DC
  Administered 2015-04-24 – 2015-04-26 (×7): 30 mg via INTRAVENOUS
  Filled 2015-04-24 (×12): qty 1

## 2015-04-24 MED ORDER — HYDROMORPHONE HCL 1 MG/ML IJ SOLN
0.2500 mg | INTRAMUSCULAR | Status: DC | PRN
  Administered 2015-04-24 (×2): 0.25 mg via INTRAVENOUS

## 2015-04-24 MED ORDER — GLYCOPYRROLATE 0.2 MG/ML IJ SOLN
INTRAMUSCULAR | Status: DC | PRN
  Administered 2015-04-24: 0.6 mg via INTRAVENOUS

## 2015-04-24 MED ORDER — LACTATED RINGERS IV SOLN: INTRAVENOUS | Status: DC

## 2015-04-24 MED ORDER — SODIUM CHLORIDE 0.9 % IJ SOLN
INTRAMUSCULAR | Status: DC | PRN
  Administered 2015-04-24: 20 mL

## 2015-04-24 MED ORDER — 0.9 % SODIUM CHLORIDE (POUR BTL) OPTIME
TOPICAL | Status: DC | PRN
  Administered 2015-04-24: 2000 mL

## 2015-04-24 SURGICAL SUPPLY — 31 items
BARRIER ADHS 3X4 INTERCEED (GAUZE/BANDAGES/DRESSINGS) IMPLANT
CLEANER TIP ELECTROSURG 2X2 (MISCELLANEOUS) ×3 IMPLANT
CLOSURE WOUND 1/4X4 (GAUZE/BANDAGES/DRESSINGS)
DRAPE WARM FLUID 44X44 (DRAPE) ×3 IMPLANT
DRSG OPSITE POSTOP 4X10 (GAUZE/BANDAGES/DRESSINGS) ×3 IMPLANT
DRSG PAD ABDOMINAL 8X10 ST (GAUZE/BANDAGES/DRESSINGS) ×3 IMPLANT
ELECT LIGASURE SHORT 9 REUSE (ELECTRODE) IMPLANT
GAUZE SPONGE 4X4 12PLY STRL (GAUZE/BANDAGES/DRESSINGS) ×3 IMPLANT
GLOVE BIO SURGEON STRL SZ7 (GLOVE) ×9 IMPLANT
GOWN STRL REUS W/TWL LRG LVL3 (GOWN DISPOSABLE) ×9 IMPLANT
KIT BASIN OR (CUSTOM PROCEDURE TRAY) ×3 IMPLANT
LIGASURE IMPACT 36 18CM CVD LR (INSTRUMENTS) IMPLANT
NEEDLE SPNL 20GX3.5 QUINCKE YW (NEEDLE) ×3 IMPLANT
NS IRRIG 1000ML POUR BTL (IV SOLUTION) ×6 IMPLANT
PACK GENERAL/GYN (CUSTOM PROCEDURE TRAY) ×3 IMPLANT
PAD OB MATERNITY 4.3X12.25 (PERSONAL CARE ITEMS) IMPLANT
SPONGE LAP 18X18 X RAY DECT (DISPOSABLE) ×6 IMPLANT
STRIP CLOSURE SKIN 1/4X4 (GAUZE/BANDAGES/DRESSINGS) IMPLANT
SUT CHROMIC 3 0 SH 27 (SUTURE) IMPLANT
SUT MNCRL AB 4-0 PS2 18 (SUTURE) ×3 IMPLANT
SUT MON AB 2-0 CT1 36 (SUTURE) IMPLANT
SUT PDS AB 0 CT1 36 (SUTURE) ×6 IMPLANT
SUT VIC AB 0 CT1 36 (SUTURE) ×9 IMPLANT
SUT VIC AB 2-0 CT1 27 (SUTURE)
SUT VIC AB 2-0 CT1 TAPERPNT 27 (SUTURE) IMPLANT
SUT VIC AB 3-0 CT1 27 (SUTURE) ×4
SUT VIC AB 3-0 CT1 TAPERPNT 27 (SUTURE) ×2 IMPLANT
SUT VICRYL 0 TIES 12 18 (SUTURE) ×3 IMPLANT
SYR 20CC LL (SYRINGE) ×3 IMPLANT
TAPE CLOTH SURG 4X10 WHT LF (GAUZE/BANDAGES/DRESSINGS) ×3 IMPLANT
TRAY FOLEY W/METER SILVER 14FR (SET/KITS/TRAYS/PACK) ×3 IMPLANT

## 2015-04-24 NOTE — Progress Notes (Signed)
The patient was re-examined with no change in status 

## 2015-04-24 NOTE — Op Note (Signed)
Preoperative diagnosis:symptomatic leiomyoma  Postoperative diagnosis: Same  Procedure: TAH, bilateral salpingectomy, lysis of adhesions  Surgeon: Matthew Saras  Asst.: [place]  EBL:2 50 cc  Specimens removed: Uterus, bilateral tubes, to pathology  Procedure and findings:  Patient was taken the operating room after an adequate level of general anesthesia was obtained with the patient supine the abdomen prepped and draped, followed by vaginal prep and placement of Foley catheter. Patient was draped out appropriate timeouts taken at that point.  Transverse incision made excising the old scar this is carried out of the fascia, moderate scarring was noted.This was extended transversely. Rectus muscles divided in the midline, peritoneum entered superiorly without incident.There was a band of some scarring between the uterus and the anterior abdominal wall which was lysed, there was no bowel involvement in this area. Once this was completed O'Connor-O'Sullivan retractor was positioned bowel Speck superiorly out of the field. Long Kelly clamps were then placed at each utero-ovarian pedicle the uterus itself was enlarged 12-14 week with multiple irregular fibroids bilateral adnexa unremarkable.  Starting on the left the round ligament was clamped divided and suture ligated with oh Vicryls surgeon's finger was then used to perforate the posterior leaf the broad ligament isolating the utero-ovarian pedicle. This was released, free tie followed by suture ligature oh Vicryls. Thus conserving the tube and ovary The exact same repeated on the opposite side the peritoneum was carried around to the midline anteriorly and the bladder was dissected with sharp and blunt dissection below the cervical vaginal junction. Bilateral salpingectomy was then carried out. In sequential manner the ascending branch of the uterine artery,cardinal ligament,uterosacral ligament and cervical vaginal pedicles were clamped divided and suture  ligated close to the uterus. The specimen was thus excised. Cuff was closed with interrupted 0 Vicryls sutures. The pelvis is irrigated with saline and noted be hemostatic.Prior to closure sponge, needle, S precast reported as correct 2. Peritoneum closed with a 30 Vicryls suture 30 Vicryls interrupted sutures used to reapproximate the rectus muscles in the midline. 0 PDS was then used from laterally to midline on either side a closed fashion.I had to undermine thesubcutaneous tissue to reduce tension this was irrigated and made hemostatic with the Bovie a 50% Exparel/saline solution was injected into the subfascial area and also the subcutaneous area for postoperative analgesia. Asq tissue was not closed separately 4-0 Monocryl subcutaneous particular closure along with a pressure dressing. Clear urine noted in the case she tolerated this well went to recovery room in good condition.  Dictated with De BorgiaD.

## 2015-04-24 NOTE — Anesthesia Procedure Notes (Signed)
Procedure Name: Intubation Date/Time: 04/24/2015 7:38 AM Performed by: Montel Clock Pre-anesthesia Checklist: Patient identified, Emergency Drugs available, Suction available, Patient being monitored and Timeout performed Patient Re-evaluated:Patient Re-evaluated prior to inductionOxygen Delivery Method: Circle system utilized Preoxygenation: Pre-oxygenation with 100% oxygen Intubation Type: IV induction Ventilation: Mask ventilation without difficulty and Oral airway inserted - appropriate to patient size Laryngoscope Size: Mac and 3 Grade View: Grade I Tube type: Oral Tube size: 7.0 mm Number of attempts: 1 Airway Equipment and Method: Stylet Placement Confirmation: ETT inserted through vocal cords under direct vision,  positive ETCO2 and breath sounds checked- equal and bilateral Secured at: 21 cm Tube secured with: Tape Dental Injury: Teeth and Oropharynx as per pre-operative assessment

## 2015-04-24 NOTE — Anesthesia Postprocedure Evaluation (Signed)
  Anesthesia Post-op Note  Patient: [name]  Procedure(s) Performed: Procedure(s) (LRB): TOTAL ABDOMINAL HYSTERECTOMY, BILATERAL SALPINGECTOMY (N/A)  Patient Location: PACU  Anesthesia Type: General  Level of Consciousness: awake and alert   Airway and Oxygen Therapy: Patient Spontanous Breathing  Post-op Pain: mild  Post-op Assessment: Post-op Vital signs reviewed, Patient's Cardiovascular Status Stable, Respiratory Function Stable, Patent Airway and No signs of Nausea or vomiting  Last Vitals:  Filed Vitals:   04/24/15 1000  BP: 121/66  Pulse: 78  Temp:   Resp: 14    Post-op Vital Signs: stable   Complications: No apparent anesthesia complications

## 2015-04-24 NOTE — Transfer of Care (Signed)
Immediate Anesthesia Transfer of Care Note  Patient: [name]  Procedure(s) Performed: Procedure(s): TOTAL ABDOMINAL HYSTERECTOMY, BILATERAL SALPINGECTOMY (N/A)  Patient Location: PACU  Anesthesia Type:General  Level of Consciousness:  sedated, patient cooperative and responds to stimulation  Airway & Oxygen Therapy:Patient Spontanous Breathing and Patient connected to face mask oxgen  Post-op Assessment:  Report given to PACU RN and Post -op Vital signs reviewed and stable  Post vital signs:  Reviewed and stable  Last Vitals:  Filed Vitals:   04/24/15 0543  BP: 101/71  Pulse: 72  Temp: 37.1 C  Resp: 18    Complications: No apparent anesthesia complications

## 2015-04-25 ENCOUNTER — Encounter (HOSPITAL_COMMUNITY): Payer: Self-pay | Admitting: Obstetrics and Gynecology

## 2015-04-25 LAB — CBC
HCT: 31.4 % — ABNORMAL LOW (ref 36.0–46.0)
Hemoglobin: 10.7 g/dL — ABNORMAL LOW (ref 12.0–15.0)
MCH: 31 pg (ref 26.0–34.0)
MCHC: 34.1 g/dL (ref 30.0–36.0)
MCV: 91 fL (ref 78.0–100.0)
Platelets: 253 10*3/uL (ref 150–400)
RBC: 3.45 MIL/uL — ABNORMAL LOW (ref 3.87–5.11)
RDW: 12 % (ref 11.5–15.5)
WBC: 11.3 10*3/uL — ABNORMAL HIGH (ref 4.0–10.5)

## 2015-04-25 NOTE — Progress Notes (Signed)
04/25/15  1530  Patient IV is saline locked. No morphine PCA is running.

## 2015-04-25 NOTE — Progress Notes (Signed)
1 Day Post-Op Procedure(s) (LRB): TOTAL ABDOMINAL HYSTERECTOMY, BILATERAL SALPINGECTOMY (N/A)  Subjective: Patient reports tolerating PO.    Objective: I have reviewed patient's vital signs, medications and labs. BP 115/72 mmHg  Pulse 100  Temp(Src) 99.3 F (37.4 C) (Oral)  Resp 16  Ht 4\' 11"  (1.499 m)  Wt 166 lb (75.297 kg)  BMI 33.51 kg/m2  SpO2 96%  LMP 04/17/2015 CBC    Component Value Date/Time   WBC 11.3* 04/25/2015 0450   WBC 6.0 11/16/2012 1302   RBC 3.45* 04/25/2015 0450   RBC 4.12 11/16/2012 1302   HGB 10.7* 04/25/2015 0450   HGB 12.8 11/16/2012 1302   HCT 31.4* 04/25/2015 0450   HCT 38.1 11/16/2012 1302   PLT 253 04/25/2015 0450   PLT 284 11/16/2012 1302   MCV 91.0 04/25/2015 0450   MCV 92 11/16/2012 1302   MCH 31.0 04/25/2015 0450   MCH 31.0 11/16/2012 1302   MCHC 34.1 04/25/2015 0450   MCHC 33.5 11/16/2012 1302   RDW 12.0 04/25/2015 0450   RDW 12.9 11/16/2012 1302     abd soft + bs, bandage dry  Assessment: s/p Procedure(s): TOTAL ABDOMINAL HYSTERECTOMY, BILATERAL SALPINGECTOMY (N/A): stable  Plan: Advance diet Encourage ambulation Advance to PO medication Discontinue IV fluids  LOS: 1 day    Diamante Truszkowski M 04/25/2015, 8:49 AM

## 2015-04-26 MED ORDER — IBUPROFEN 800 MG PO TABS
800.0000 mg | ORAL_TABLET | Freq: Three times a day (TID) | ORAL | Status: DC | PRN
Start: 2015-04-26 — End: 2015-06-08

## 2015-04-26 MED ORDER — ONDANSETRON HCL 4 MG PO TABS: 4.0000 mg | ORAL_TABLET | Freq: Four times a day (QID) | ORAL | Status: DC | PRN

## 2015-04-26 MED ORDER — OXYCODONE-ACETAMINOPHEN 5-325 MG PO TABS: 1.0000 | ORAL_TABLET | ORAL | Status: DC | PRN

## 2015-04-26 NOTE — Discharge Summary (Signed)
Physician Discharge Summary  Patient ID: [name] MRN: [id number] DOB/AGE: [birth year] 44 y.o.  Admit date: 04/24/2015 Discharge date: 04/26/2015  Admission [place]  Discharge Diagnoses: same Active Problems:   Leiomyoma   Discharged Condition: good  Hospital Course: adm for TAH, on POD 2, afeb, tol PO, inc C/D  Consults: None  Significant Diagnostic Studies: labs:  CBC    Component Value Date/Time   WBC 11.3* 04/25/2015 0450   WBC 6.0 11/16/2012 1302   RBC 3.45* 04/25/2015 0450   RBC 4.12 11/16/2012 1302   HGB 10.7* 04/25/2015 0450   HGB 12.8 11/16/2012 1302   HCT 31.4* 04/25/2015 0450   HCT 38.1 11/16/2012 1302   PLT 253 04/25/2015 0450   PLT 284 11/16/2012 1302   MCV 91.0 04/25/2015 0450   MCV 92 11/16/2012 1302   MCH 31.0 04/25/2015 0450   MCH 31.0 11/16/2012 1302   MCHC 34.1 04/25/2015 0450   MCHC 33.5 11/16/2012 1302   RDW 12.0 04/25/2015 0450   RDW 12.9 11/16/2012 1302      Treatments: surgery: TAH, bilat salpingectomy  Discharge Exam: Blood pressure 100/64, pulse 73, temperature 98.4 F (36.9 C), temperature source Oral, resp. rate 16, height 4\' 11"  (1.499 m), weight 166 lb (75.297 kg), last menstrual period 04/17/2015, SpO2 99 %. General appearance: alert Incision/Wound:C/D  Disposition: Final discharge disposition not confirmed     Medication List    STOP taking these medications        ALYACEN 7/7/7 0.5/0.75/1-35 MG-MCG tablet  Generic drug:  norethindrone-ethinyl estradiol     diclofenac 75 MG EC tablet  Commonly known as:  VOLTAREN      TAKE these medications        cetirizine 10 MG tablet  Commonly known as:  ZYRTEC  Take 10 mg by mouth daily as needed for allergies.     clonazePAM 0.5 MG tablet  Commonly known as:  KLONOPIN  Take 0.5 mg by mouth 2 (two) times daily as needed for anxiety.     ibuprofen 800 MG tablet  Commonly known as:  ADVIL,MOTRIN  Take 1 tablet (800 mg total) by mouth every 8 (eight)  hours as needed for moderate pain (mild pain).     multivitamin with minerals Tabs tablet  Take 1 tablet by mouth every morning.     ondansetron 4 MG tablet  Commonly known as:  ZOFRAN  Take 1 tablet (4 mg total) by mouth every 6 (six) hours as needed for nausea.     oxyCODONE-acetaminophen 5-325 MG per tablet  Commonly known as:  PERCOCET/ROXICET  Take 1-2 tablets by mouth every 4 (four) hours as needed for severe pain (moderate to severe pain (when tolerating fluids)).           Follow-up Information    Follow up with Margarette Asal, MD. Schedule an appointment as soon as possible for a visit in 10 days.   Specialty:  Obstetrics and Gynecology   Contact information:   [place] [address] [phone]       Signed: Margarette Asal 04/26/2015, 9:01 AM

## 2015-04-26 NOTE — Care Management Note (Signed)
Case Management Note  Patient Details  Name: MEAGHAN WHISTLER MRN: [id number] Date of Birth: 06-Apr-1971  Subjective/Objective:          Admitted s/p TAH, bilateral salpingectomy, lysis of adhesions          Action/Plan: Discharge planning  Expected Discharge Date:  04/25/15               Expected Discharge Plan:  Home/Self Care  In-House Referral:  NA  Discharge planning Services  CM Consult  Status of Service:  Completed, signed off  Medicare Important Message Given:  N/A - LOS <3 / Initial given by admissions Date Medicare IM Given:    Medicare IM give by:    Date Additional Medicare IM Given:    Additional Medicare Important Message give by:     If discussed at [place] of Stay Meetings, dates discussed:    Additional Comments:  Guadalupe Maple, RN 04/26/2015, 10:41 AM

## 2015-04-26 NOTE — Progress Notes (Signed)
Patient alert and oriented. Patient received discharge instructions and all questions and concerns were answered. Patient given prescriptions for home medications and left unit via wheelchair with NT and family member.

## 2015-05-10 ENCOUNTER — Other Ambulatory Visit: Payer: Self-pay

## 2015-05-10 DIAGNOSIS — D242 Benign neoplasm of left breast: Secondary | ICD-10-CM

## 2015-06-08 ENCOUNTER — Encounter (HOSPITAL_COMMUNITY): Payer: Self-pay

## 2015-06-08 ENCOUNTER — Emergency Department (HOSPITAL_COMMUNITY): Payer: Managed Care, Other (non HMO)

## 2015-06-08 ENCOUNTER — Emergency Department (HOSPITAL_COMMUNITY)
Admission: EM | Admit: 2015-06-08 | Discharge: 2015-06-08 | Disposition: A | Discharge status: Home / Self Care | Payer: Managed Care, Other (non HMO) | Attending: Emergency Medicine | Admitting: Emergency Medicine

## 2015-06-08 DIAGNOSIS — Z90710 Acquired absence of both cervix and uterus: Secondary | ICD-10-CM | POA: Diagnosis not present

## 2015-06-08 DIAGNOSIS — Z8719 Personal history of other diseases of the digestive system: Secondary | ICD-10-CM | POA: Diagnosis not present

## 2015-06-08 DIAGNOSIS — Z79899 Other long term (current) drug therapy: Secondary | ICD-10-CM | POA: Diagnosis not present

## 2015-06-08 DIAGNOSIS — R1032 Left lower quadrant pain: Secondary | ICD-10-CM | POA: Diagnosis not present

## 2015-06-08 DIAGNOSIS — G43909 Migraine, unspecified, not intractable, without status migrainosus: Secondary | ICD-10-CM | POA: Diagnosis not present

## 2015-06-08 DIAGNOSIS — Z3202 Encounter for pregnancy test, result negative: Secondary | ICD-10-CM | POA: Diagnosis not present

## 2015-06-08 DIAGNOSIS — F419 Anxiety disorder, unspecified: Secondary | ICD-10-CM | POA: Diagnosis not present

## 2015-06-08 DIAGNOSIS — G8918 Other acute postprocedural pain: Secondary | ICD-10-CM | POA: Diagnosis present

## 2015-06-08 LAB — URINALYSIS, ROUTINE W REFLEX MICROSCOPIC
Bilirubin Urine: NEGATIVE
GLUCOSE, UA: NEGATIVE mg/dL
HGB URINE DIPSTICK: NEGATIVE
KETONES UR: NEGATIVE mg/dL
Leukocytes, UA: NEGATIVE
NITRITE: NEGATIVE
PH: 6.5 (ref 5.0–8.0)
Protein, ur: NEGATIVE mg/dL
Specific Gravity, Urine: 1.014 (ref 1.005–1.030)
Urobilinogen, UA: 0.2 mg/dL (ref 0.0–1.0)

## 2015-06-08 LAB — CBC WITH DIFFERENTIAL/PLATELET
BASOS ABS: 0 10*3/uL (ref 0.0–0.1)
BASOS PCT: 0 % (ref 0–1)
EOS ABS: 0.3 10*3/uL (ref 0.0–0.7)
Eosinophils Relative: 5 % (ref 0–5)
HCT: 38.4 % (ref 36.0–46.0)
Hemoglobin: 13.1 g/dL (ref 12.0–15.0)
Lymphocytes Relative: 39 % (ref 12–46)
Lymphs Abs: 2.1 10*3/uL (ref 0.7–4.0)
MCH: 31.2 pg (ref 26.0–34.0)
MCHC: 34.1 g/dL (ref 30.0–36.0)
MCV: 91.4 fL (ref 78.0–100.0)
MONO ABS: 0.3 10*3/uL (ref 0.1–1.0)
Monocytes Relative: 6 % (ref 3–12)
Neutro Abs: 2.6 10*3/uL (ref 1.7–7.7)
Neutrophils Relative %: 50 % (ref 43–77)
PLATELETS: 309 10*3/uL (ref 150–400)
RBC: 4.2 MIL/uL (ref 3.87–5.11)
RDW: 12.3 % (ref 11.5–15.5)
WBC: 5.3 10*3/uL (ref 4.0–10.5)

## 2015-06-08 LAB — COMPREHENSIVE METABOLIC PANEL
ALT: 19 U/L (ref 14–54)
ANION GAP: 8 (ref 5–15)
AST: 23 U/L (ref 15–41)
Albumin: 4.1 g/dL (ref 3.5–5.0)
Alkaline Phosphatase: 61 U/L (ref 38–126)
BUN: 11 mg/dL (ref 6–20)
CALCIUM: 9.4 mg/dL (ref 8.9–10.3)
CHLORIDE: 106 mmol/L (ref 101–111)
CO2: 26 mmol/L (ref 22–32)
CREATININE: 0.67 mg/dL (ref 0.44–1.00)
GFR calc non Af Amer: >60 mL/min (ref >60)
Glucose, Bld: 101 mg/dL — ABNORMAL HIGH (ref 65–99)
Potassium: 3.4 mmol/L — ABNORMAL LOW (ref 3.5–5.1)
Sodium: 140 mmol/L (ref 135–145)
Total Bilirubin: 0.9 mg/dL (ref 0.3–1.2)
Total Protein: 7.7 g/dL (ref 6.5–8.1)

## 2015-06-08 LAB — I-STAT BETA HCG BLOOD, ED (MC, WL, AP ONLY)

## 2015-06-08 MED ORDER — IOHEXOL 300 MG/ML  SOLN
100.0000 mL | Freq: Once | INTRAMUSCULAR | Status: AC | PRN
  Administered 2015-06-08: 100 mL via INTRAVENOUS

## 2015-06-08 MED ORDER — HYDROCODONE-ACETAMINOPHEN 5-325 MG PO TABS: 1.0000 | ORAL_TABLET | ORAL | Status: AC | PRN

## 2015-06-08 MED ORDER — ONDANSETRON HCL 4 MG/2ML IJ SOLN
4.0000 mg | Freq: Once | INTRAMUSCULAR | Status: AC
  Administered 2015-06-08: 4 mg via INTRAVENOUS
  Filled 2015-06-08: qty 2

## 2015-06-08 MED ORDER — IBUPROFEN 600 MG PO TABS: 600.0000 mg | ORAL_TABLET | Freq: Four times a day (QID) | ORAL | Status: AC | PRN

## 2015-06-08 MED ORDER — IOHEXOL 300 MG/ML  SOLN
50.0000 mL | Freq: Once | INTRAMUSCULAR | Status: AC | PRN
Start: 2015-06-08 — End: 2015-06-08
  Administered 2015-06-08: 50 mL via ORAL

## 2015-06-08 MED ORDER — HYDROMORPHONE HCL 1 MG/ML IJ SOLN
1.0000 mg | Freq: Once | INTRAMUSCULAR | Status: AC
  Administered 2015-06-08: 1 mg via INTRAVENOUS
  Filled 2015-06-08: qty 1

## 2015-06-08 NOTE — ED Notes (Signed)
MD at bedside. 

## 2015-06-08 NOTE — ED Provider Notes (Signed)
CSN: [id number]     Arrival date & time 06/08/15  1249 History   First MD Initiated Contact with Patient 06/08/15 1330     Chief Complaint  Patient presents with  . Post-op Problem  . Abdominal Pain     HPI Patient presents emergency department with ongoing discomfort and pain since her abdominal hysterectomy on 04/24/2015.  She states that initially the pain becomes better now she's slightly had ongoing discomfort and pain in her lower abdomen and left lower quadrant.  No fevers or chills.  Reports occasional nausea but none at this time.  Denies vomiting.  No diarrhea.  Denies dysuria or urinary frequency.  No vaginal complaints.  She saw her OB/GYN today who believed this is more IBS related and did not believe this was a complication from her recent abdominal hysterectomy.  She came to the ER for second opinion.  She denies back pain or flank pain.  Her pain is moderate in severity.    Past Medical History  Diagnosis Date  . Anxiety   . Chest pain     chest pain with anxiety  . GERD (gastroesophageal reflux disease)     hx. acid reflux  . Headache     migraines   Past Surgical History  Procedure Laterality Date  . Cesarean section  [id number]  . Breast biopsy Left 2012  . Left breast lumpectomy in 2015    . C-section x 2    . Abdominal hysterectomy N/A 04/24/2015    Procedure: TOTAL ABDOMINAL HYSTERECTOMY, BILATERAL SALPINGECTOMY;  Surgeon: Molli Posey, MD;  Location: [facility];  Service: Gynecology;  Laterality: N/A;   Family History  Problem Relation Age of Onset  . Cancer Cousin     breast - 2nd cousin   History  Substance Use Topics  . Smoking status: Never Smoker   . Smokeless tobacco: Never Used  . Alcohol Use: Yes     Comment: 1-2 glasses of wine per week   OB History    Gravida Para Term Preterm AB TAB SAB Ectopic Multiple Living   3 2   1  1   2       Obstetric Comments   1st Menstrual Cycle:  9 1st Pregnancy:  17     Review of Systems  All other  systems reviewed and are negative.     Allergies  Review of patient's allergies indicates no known allergies.  Home Medications   Prior to Admission medications   Medication Sig Start Date End Date Taking? Authorizing Provider  cetirizine (ZYRTEC) 10 MG tablet Take 10 mg by mouth daily as needed for allergies.   Yes Historical Provider, MD  clonazePAM (KLONOPIN) 0.5 MG tablet Take 0.5 mg by mouth 2 (two) times daily as needed for anxiety.   Yes Historical Provider, MD  Multiple Vitamin (MULTIVITAMIN WITH MINERALS) TABS tablet Take 1 tablet by mouth every morning.   Yes Historical Provider, MD  HYDROcodone-acetaminophen (NORCO/VICODIN) 5-325 MG per tablet Take 1 tablet by mouth every 4 (four) hours as needed for moderate pain. 06/08/15   Jola Schmidt, MD  ibuprofen (ADVIL,MOTRIN) 600 MG tablet Take 1 tablet (600 mg total) by mouth every 6 (six) hours as needed. 06/08/15   Jola Schmidt, MD   BP 124/80 mmHg  Pulse 77  Temp(Src) 98.6 F (37 C) (Oral)  Resp 16  SpO2 98%  LMP 04/17/2015 Physical Exam  Constitutional: She is oriented to person, place, and time. She appears well-developed and well-nourished. No distress.  HENT:  Head: Normocephalic and atraumatic.  Eyes: EOM are normal.  Neck: Normal range of motion.  Cardiovascular: Normal rate, regular rhythm and normal heart sounds.   Pulmonary/Chest: Effort normal and breath sounds normal.  Abdominal: Soft. She exhibits no distension.  Mild left lower quadrant abdominal tenderness.  No guarding or rebound.  Incision is intact except for a very small amount of wound dehiscence without drainage.  No surrounding erythema.  Musculoskeletal: Normal range of motion.  Neurological: She is alert and oriented to person, place, and time.  Skin: Skin is warm and dry.  Psychiatric: She has a normal mood and affect. Judgment normal.  Nursing note and vitals reviewed.   ED Course  Procedures (including critical care time) Labs Review Labs  Reviewed  COMPREHENSIVE METABOLIC PANEL - Abnormal; Notable for the following:    Potassium 3.4 (*)    Glucose, Bld 101 (*)    All other components within normal limits  CBC WITH DIFFERENTIAL/PLATELET  URINALYSIS, ROUTINE W REFLEX MICROSCOPIC (NOT AT [facility])  I-STAT BETA HCG BLOOD, ED ([facility], [facility], [facility] ONLY)    Imaging Review Ct Abdomen Pelvis W Contrast  06/08/2015   CLINICAL DATA:  Left lower quadrant abdominal pain and dysuria since hysterectomy and June 21st, bleeding at the wound started this morning, severe pain  EXAM: CT ABDOMEN AND PELVIS WITH CONTRAST  TECHNIQUE: Multidetector CT imaging of the abdomen and pelvis was performed using the standard protocol following bolus administration of intravenous contrast.  CONTRAST:  34mL OMNIPAQUE IOHEXOL 300 MG/ML SOLN, 176mL OMNIPAQUE IOHEXOL 300 MG/ML SOLN  COMPARISON:  None  FINDINGS: Lower chest:  Clear  Hepatobiliary: Normal  Pancreas: Normal  Spleen: Normal  Adrenals/Urinary Tract: Normal  Stomach/Bowel: Normal  Vascular/Lymphatic: Normal  Reproductive: Uterus is absent. 22 mm simple appearing cystic structure right adnexa and 16 mm similar structure left adnexa likely ovarian cysts or follicles.  Other: At most there is trace free fluid in the pelvis. There is mild increased attenuation in the anterior midline of the pelvis just anterior and cranial to the bladder, with heterogeneous thickening of the central anterior pelvic wall musculature. There is likely a small 2-3 mm intramuscular hematoma.  Musculoskeletal: No acute findings  IMPRESSION: Mild postoperative change in the anterior pelvis. Small anterior pelvic wall intramuscular hematoma suspected.   Electronically Signed   By: Skipper Cliche M.D.   On: 06/08/2015 15:50  I personally reviewed the imaging tests through PACS system I reviewed available ER/hospitalization records through the EMR    EKG Interpretation None      MDM   Final diagnoses:  Left lower quadrant pain    No deep  space infection.  Small hematoma.  Incision is without segment signs of infection.  This will likely resolve over the next several weeks.  Primary care follow-up.    Jola Schmidt, MD 06/08/15 1630

## 2015-06-08 NOTE — ED Notes (Signed)
Pt c/o LLQ abdominal pain and dysuria since hysterectomy on 6/21 and wound bleeding starting this morning.  Pain score 10/10.  Pt reports taking ibuprofen w/ relief, but sts "it wore off."  Pt reports being seen by surgeon this morning and was told that she may have IBS.  Surgeon was not concerned w/ wound bleeding.  Pt sts "he said that it was just a little spot and would heal on its own."

## 2015-06-08 NOTE — Discharge Instructions (Signed)

## 2015-06-13 ENCOUNTER — Ambulatory Visit
Admission: RE | Admit: 2015-06-13 | Discharge: 2015-06-13 | Disposition: A | Discharge status: Home / Self Care | Payer: Managed Care, Other (non HMO) | Source: Ambulatory Visit | Attending: General Surgery | Admitting: General Surgery

## 2015-06-13 ENCOUNTER — Ambulatory Visit: Payer: Managed Care, Other (non HMO)

## 2015-06-13 DIAGNOSIS — R921 Mammographic calcification found on diagnostic imaging of breast: Secondary | ICD-10-CM | POA: Insufficient documentation

## 2015-06-13 DIAGNOSIS — D242 Benign neoplasm of left breast: Secondary | ICD-10-CM

## 2015-06-13 DIAGNOSIS — N63 Unspecified lump in breast: Secondary | ICD-10-CM | POA: Diagnosis present

## 2015-06-14 ENCOUNTER — Other Ambulatory Visit: Payer: Managed Care, Other (non HMO)

## 2015-06-20 ENCOUNTER — Ambulatory Visit: Payer: Managed Care, Other (non HMO) | Admitting: General Surgery

## 2015-06-28 ENCOUNTER — Ambulatory Visit: Payer: Managed Care, Other (non HMO)

## 2015-06-28 ENCOUNTER — Other Ambulatory Visit: Payer: Managed Care, Other (non HMO)

## 2015-07-05 ENCOUNTER — Ambulatory Visit: Payer: Managed Care, Other (non HMO) | Admitting: General Surgery

## 2015-08-02 ENCOUNTER — Encounter: Payer: Self-pay | Admitting: *Deleted

## 2018-07-23 ENCOUNTER — Other Ambulatory Visit: Payer: Self-pay | Admitting: Orthopaedic Surgery

## 2018-07-23 DIAGNOSIS — M50022 Cervical disc disorder at C5-C6 level with myelopathy: Secondary | ICD-10-CM

## 2018-07-28 ENCOUNTER — Ambulatory Visit
Admission: RE | Admit: 2018-07-28 | Discharge: 2018-07-28 | Disposition: A | Discharge status: Home / Self Care | Payer: 59 | Source: Ambulatory Visit | Attending: Orthopaedic Surgery | Admitting: Orthopaedic Surgery

## 2018-07-28 DIAGNOSIS — M50022 Cervical disc disorder at C5-C6 level with myelopathy: Secondary | ICD-10-CM

## 2019-01-25 ENCOUNTER — Telehealth (INDEPENDENT_AMBULATORY_CARE_PROVIDER_SITE_OTHER): Payer: Self-pay

## 2019-01-25 NOTE — Telephone Encounter (Signed)
Called patient, answered no to all prescreening questions Would not r/s appt

## 2019-01-26 ENCOUNTER — Ambulatory Visit (INDEPENDENT_AMBULATORY_CARE_PROVIDER_SITE_OTHER): Payer: 59 | Admitting: Orthopaedic Surgery

## 2019-01-26 ENCOUNTER — Other Ambulatory Visit: Payer: Self-pay

## 2019-01-26 ENCOUNTER — Other Ambulatory Visit (INDEPENDENT_AMBULATORY_CARE_PROVIDER_SITE_OTHER): Payer: Self-pay

## 2019-01-26 ENCOUNTER — Telehealth (INDEPENDENT_AMBULATORY_CARE_PROVIDER_SITE_OTHER): Payer: Self-pay

## 2019-01-26 ENCOUNTER — Encounter (INDEPENDENT_AMBULATORY_CARE_PROVIDER_SITE_OTHER): Payer: Self-pay | Admitting: Orthopaedic Surgery

## 2019-01-26 DIAGNOSIS — M25521 Pain in right elbow: Secondary | ICD-10-CM

## 2019-01-26 MED ORDER — LIDOCAINE HCL 1 % IJ SOLN
1.0000 mL | INTRAMUSCULAR | Status: AC | PRN
  Administered 2019-01-26: 1 mL

## 2019-01-26 MED ORDER — METHYLPREDNISOLONE ACETATE 40 MG/ML IJ SUSP
40.0000 mg | INTRAMUSCULAR | Status: AC | PRN
  Administered 2019-01-26: 40 mg via INTRA_ARTICULAR

## 2019-01-26 NOTE — Telephone Encounter (Signed)
Patient called stating that her right elbow is in a lot of pain.  Stated that she is not able to raise her right arm or bend her right arm.  Received an injection in her right elbow joint today by Dr. Ninfa Linden. Has taken 2 Ibuprofen.  Would like to know if this is normal?  Cb# is [phone].  Please advise  Thank You.

## 2019-01-26 NOTE — Telephone Encounter (Signed)
Please advise 

## 2019-01-26 NOTE — Progress Notes (Signed)
Office Visit Note   Patient: [name]           Date of Birth: 08-28-1971           MRN: [id number] Visit Date: 01/26/2019              Requested by: Denton Lank, MD [address] [place], [place] [address] PCP: Denton Lank, MD   Assessment & Plan: Visit Diagnoses:  1. Pain in right elbow     Plan: She understands it certainly she may still may be experiencing radicular symptoms as it relates to her spine surgery that was done just 3 months ago in her neck.  I did provide an intra-articular steroid injection in the right elbow joint which she tolerated very well and would like to see how this helps her.  I do feel it is warranted at this point to obtain a repeat MRI for comparison purposes of her right elbow mainly assessing the radial tunnel to see if there is any nerve compression in this area there would not show up on nerve conduction studies but would certainly show up as it relates to potential for muscle atrophy or edema in this area.  We will see her back once the studies obtained.  All question concerns were answered and addressed.  Follow-Up Instructions: Return in about 4 weeks (around 02/23/2019).   Orders:  Orders Placed This Encounter  Procedures  . Medium Joint Inj: R elbow   No orders of the defined types were placed in this encounter.     Procedures: Medium Joint Inj: R elbow on 01/26/2019 10:08 AM Medications: 1 mL lidocaine 1 %; 40 mg methylPREDNISolone acetate 40 MG/ML      Clinical Data: No additional findings.   Subjective: Chief Complaint  Patient presents with  . Right Elbow - Pain  The patient is a very pleasant right-hand-dominant 48 year old who comes in for second opinion for evaluation treatment of chronic right elbow pain.  She has an interesting medical history in terms of what is going on with her right upper extremity over the last year or so.  She originally injured her right elbow in July 2009 when she fell back onto  the wall hitting that elbow against the wall.  She developed significant pain after that but was also having pain radiating from the neck down into her hand.  She was told that time she had tennis elbow and she tried a elbow brace for 6 to 8 weeks and had a steroid injection over the lateral epicondylar area.  She had 2 weeks of intense burning pain in her arm after the injection.  She even had physical therapy.  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  She is even had nerve conduction studies of the right upper extremity there were normal.  She states that she feels defeated.  She has a chronic constant pain in her elbow that is there whether she is doing activities or not.  She points to the anterior aspect of her elbow the lateral aspect of her elbow and globally around the elbow is a source of her pain.  It does radiate down the arm and up into her hand.  She also notes there is anything else that can be done on her elbow standpoint.  She has been working through all this as well.  She  does a lot of computer type work.  She is tried gabapentin and this did not help.  HPI  Review of Systems She currently denies any headache, chest pain, shortness of breath, fever, chills, nausea, vomiting  Objective: Vital Signs: LMP 04/17/2015   Physical Exam She is alert and orient x3 and in no acute distress Ortho Exam Examination of her right elbow is normal in terms of range of motion and no instability.  She has a deep pain in the anterior aspect of her elbow but also pain over the lateral epicondyle area.  Her range of motion is full.  Her hand is well-perfused with normal sensation.  There seems to be a radicular component of her pain up and down her arm.  There was no deficits in the radial nerve at its motor or sensory component. Specialty Comments:  No specialty comments  available.  Imaging: No results found. The MRI from August 2019 was reviewed of her right elbow and it showed some mild interstitial tearing of the extensor tendons at the lateral epicondyle area but otherwise no significant findings.  There is no muscle atrophy.  PMFS History: Patient Active Problem List   Diagnosis Date Noted  . Leiomyoma 04/24/2015   Past Medical History:  Diagnosis Date  . Anxiety   . Chest pain    chest pain with anxiety  . GERD (gastroesophageal reflux disease)    hx. acid reflux  . Headache    migraines    Family History  Problem Relation Age of Onset  . Cancer Cousin        breast - 2nd cousin  . Breast cancer Cousin     Past Surgical History:  Procedure Laterality Date  . ABDOMINAL HYSTERECTOMY N/A 04/24/2015   Procedure: TOTAL ABDOMINAL HYSTERECTOMY, BILATERAL SALPINGECTOMY;  Surgeon: Molli Posey, MD;  Location: [facility];  Service: Gynecology;  Laterality: N/A;  . BREAST BIOPSY Left 2012  . BREAST EXCISIONAL BIOPSY Left 2015  . c-section x 2    . CESAREAN SECTION  [id number]  . left breast lumpectomy in 2015     Social History   Occupational History  . Not on file  Tobacco Use  . Smoking status: Never Smoker  . Smokeless tobacco: Never Used  Substance and Sexual Activity  . Alcohol use: Yes    Comment: 1-2 glasses of wine per week  . Drug use: No  . Sexual activity: Not on file

## 2019-03-22 ENCOUNTER — Other Ambulatory Visit: Payer: 59

## 2019-03-24 ENCOUNTER — Ambulatory Visit: Payer: Self-pay | Admitting: Orthopaedic Surgery

## 2019-04-04 ENCOUNTER — Ambulatory Visit
Admission: RE | Admit: 2019-04-04 | Discharge: 2019-04-04 | Disposition: A | Discharge status: Home / Self Care | Payer: BLUE CROSS/BLUE SHIELD | Source: Ambulatory Visit | Attending: Orthopaedic Surgery | Admitting: Orthopaedic Surgery

## 2019-04-04 ENCOUNTER — Other Ambulatory Visit: Payer: Self-pay

## 2019-04-04 ENCOUNTER — Other Ambulatory Visit: Payer: 59

## 2019-04-04 DIAGNOSIS — M25521 Pain in right elbow: Secondary | ICD-10-CM

## 2019-04-11 ENCOUNTER — Other Ambulatory Visit: Payer: Self-pay

## 2019-04-11 ENCOUNTER — Encounter: Payer: Self-pay | Admitting: Orthopaedic Surgery

## 2019-04-11 ENCOUNTER — Ambulatory Visit (INDEPENDENT_AMBULATORY_CARE_PROVIDER_SITE_OTHER): Payer: BLUE CROSS/BLUE SHIELD | Admitting: Orthopaedic Surgery

## 2019-04-11 VITALS — Ht 59.0 in | Wt 176.0 lb

## 2019-04-11 DIAGNOSIS — M25521 Pain in right elbow: Secondary | ICD-10-CM | POA: Diagnosis not present

## 2019-04-11 NOTE — Progress Notes (Signed)
The patient returns today for follow-up after I provided a steroid injection in her right elbow joint and then sent her for an MRI.  She feels much better overall.  She says she only has an occasional pain in the elbow.  She is been dealing with issues for over a year.  She has been recovering from cervical spine surgery as well and it was felt that there was potentially radicular component of her pain.  However after providing an intra-articular steroid joint injection right elbow she reports significant relief of her symptoms.  Today she appears comfortable overall.  She looks different.  Her range of motion is now full of her right elbow.  There is no effusion.  She really looks much better overall.  MRI of her right elbow did not show any evidence of muscle atrophy or cartilage issues in the elbow joint itself.  There is no evidence of cubital or radial tunnel in terms of the areas around those nerves.  At this point given the success of her steroid injection now she looks overall follow-up can be as needed.  All question concerns were answered and addressed.  We can always reinject her elbow later this summer if needed.

## 2019-08-14 IMAGING — MR MRI OF THE RIGHT ELBOW WITHOUT CONTRAST
4 of 5 series · 19 of 40 positions shown · non-contrast
Comparison: MRI dated 06/15/2018

CLINICAL DATA: Persistent right elbow pain and limited range of
motion since a fall in May 2018.

EXAM:
MRI OF THE RIGHT ELBOW WITHOUT CONTRAST
TECHNIQUE: Multiplanar, multisequence MR imaging of the elbow was performed. No
intravenous contrast was administered.

[Series 3: T1 · axial · 3.5mm · 0.27mm/px · z∈[-44,+26]mm · 3 of 23 slices shown]
[im 4/23]
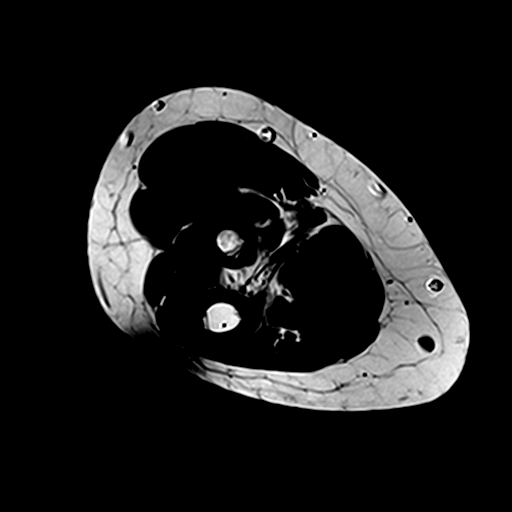
[im 13/23]
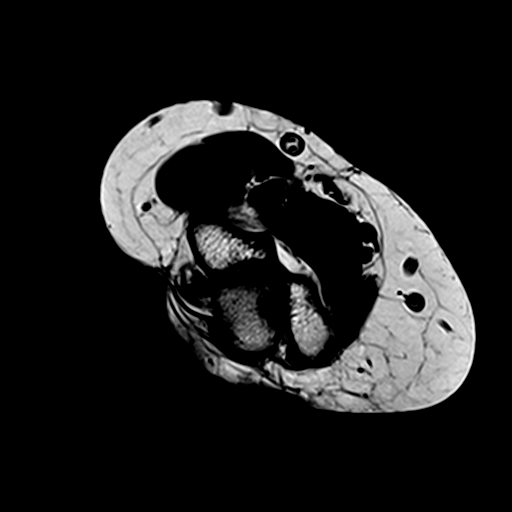
[im 19/23]
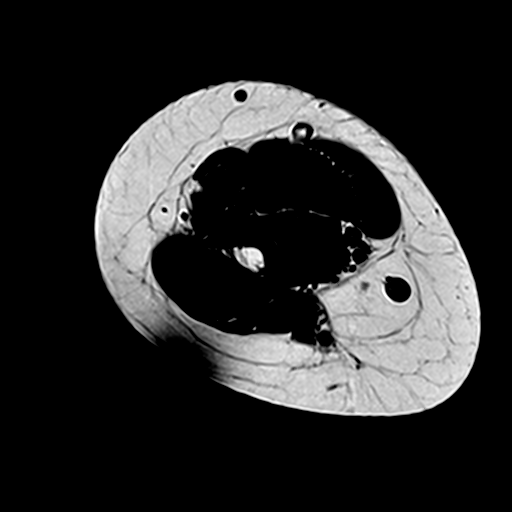

[Series 4: T2 fat-sat · axial · 3.5mm · 0.27mm/px · z∈[-48,+21]mm · 5 of 19 slices shown (1 of 2)]
[im 1/19]
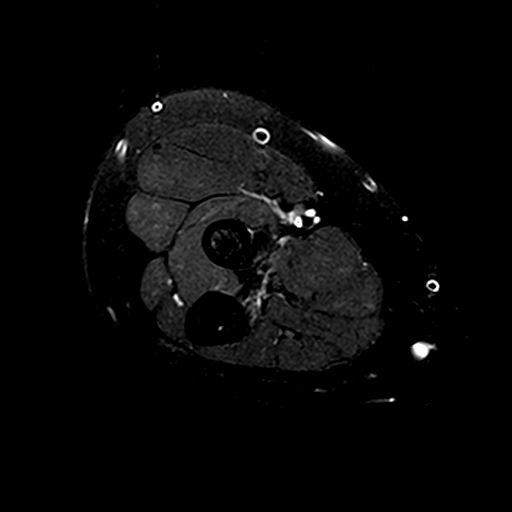
[im 3/19]
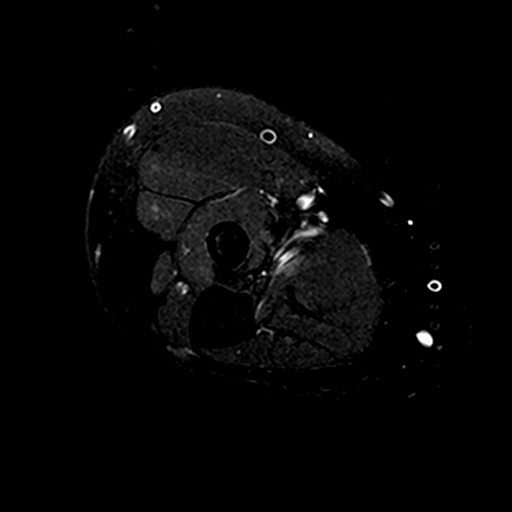
[im 6/19]
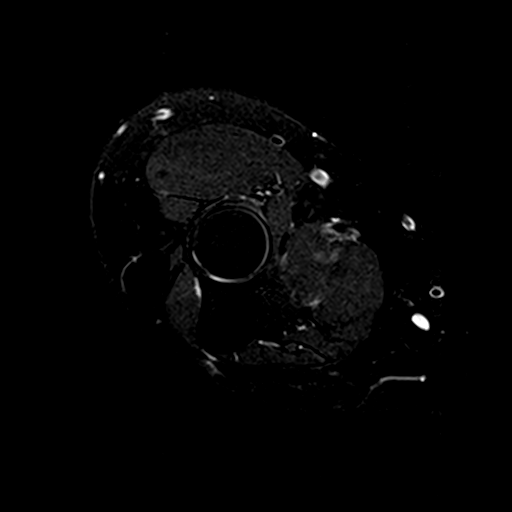
[im 11/19]
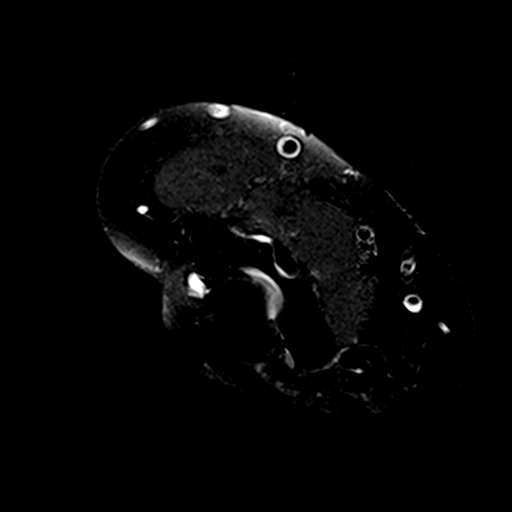
[im 16/19]
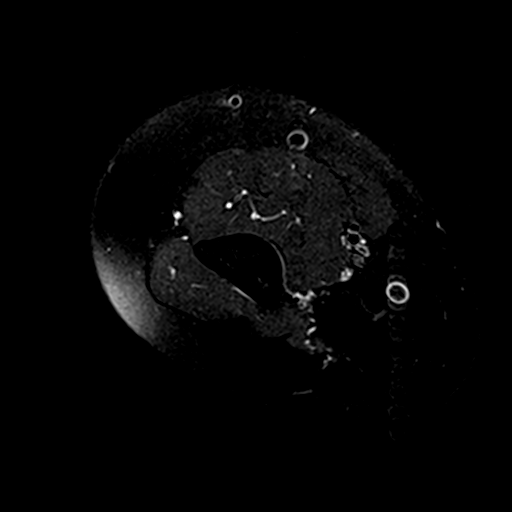

[Series 6: T2 fat-sat · coronal · 3.5mm · 0.27mm/px · 3 of 23 slices shown (2 of 2)]
[im 3/23]
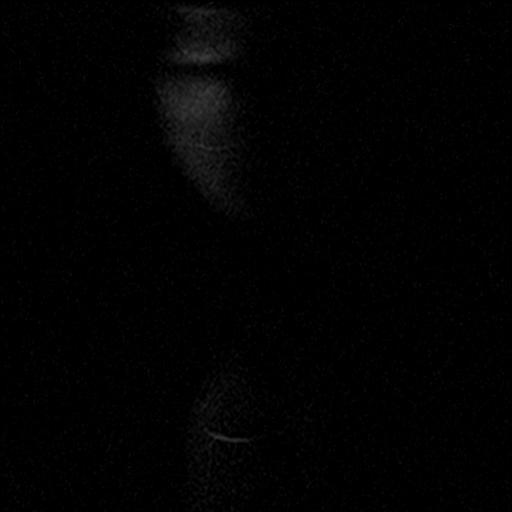
[im 12/23]
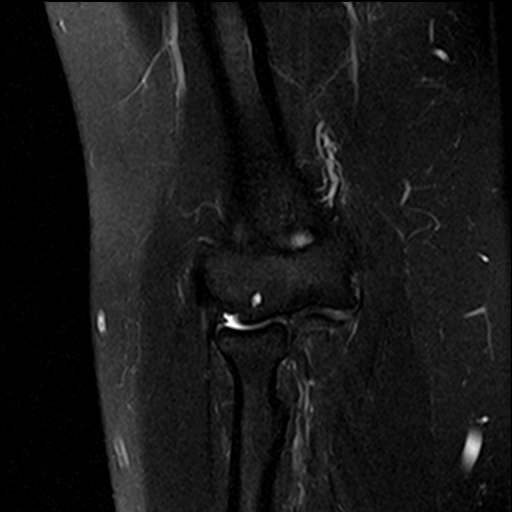
[im 20/23]
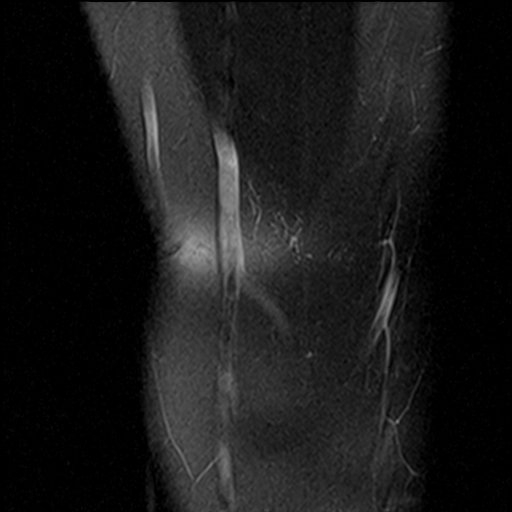

[Series 7: PD fat-sat · sagittal · 3.0mm · 0.27mm/px · 8 of 24 slices shown]
[im 1/24]
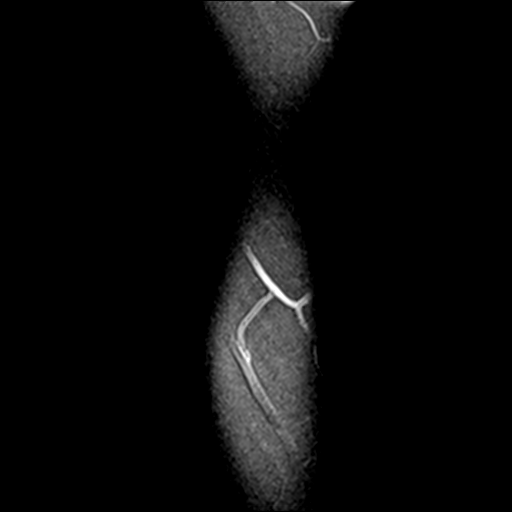
[im 3/24]
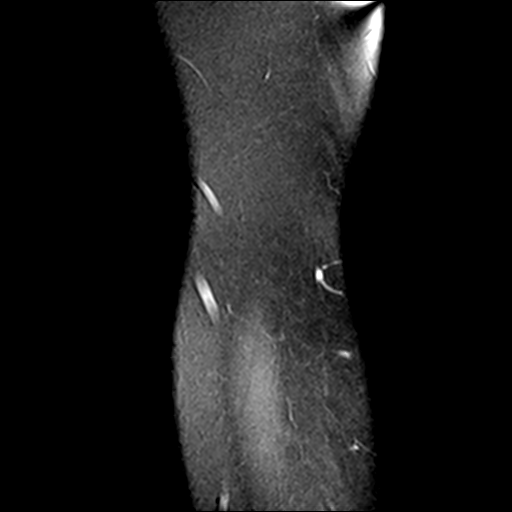
[im 8/24]
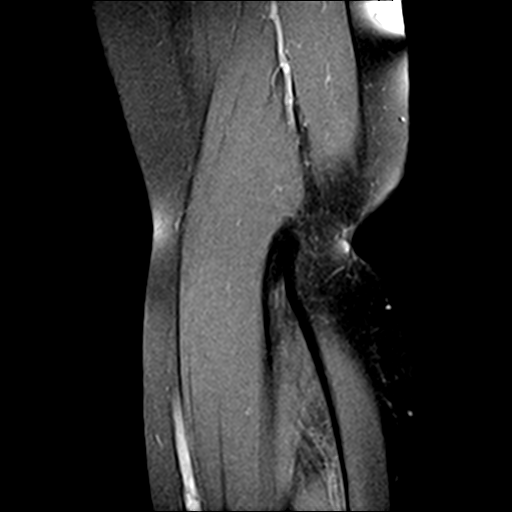
[im 11/24]
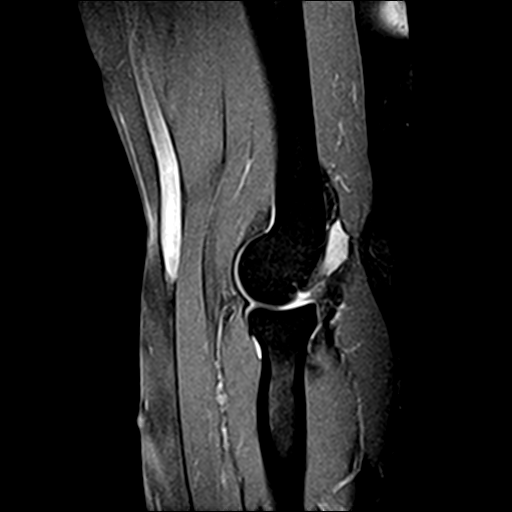
[im 13/24]
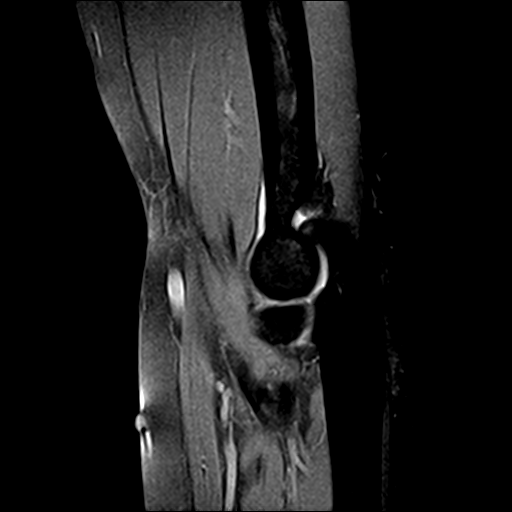
[im 16/24]
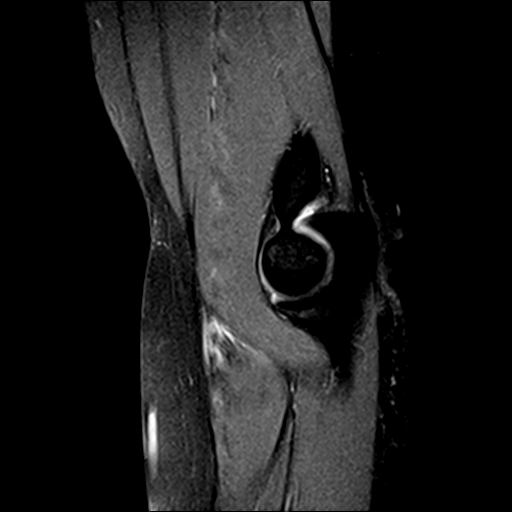
[im 21/24]
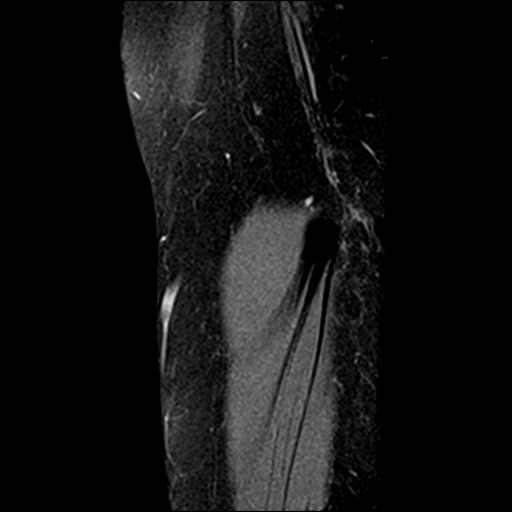
[im 24/24]
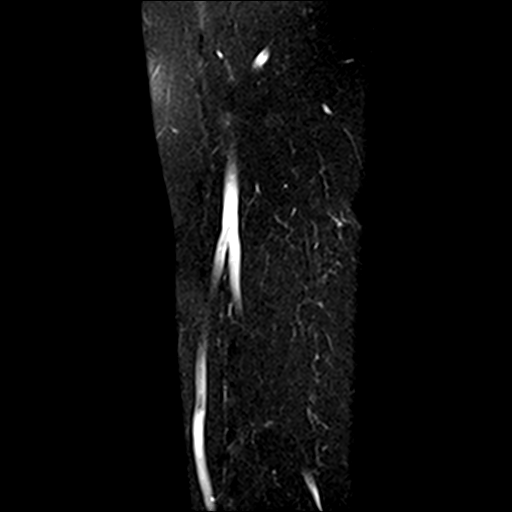

[19 of 40 positions shown; findings below may reference images not displayed]

FINDINGS: TENDONS

Common forearm flexor origin: Normal.

Common forearm extensor origin: Common extensor tendon now appears
normal.

Biceps: Normal.

Triceps: Normal.

LIGAMENTS

Medial stabilizers: Normal.

Lateral stabilizers:  Normal.

Cartilage: Normal.

Joint: Minimal joint effusion.  No loose bodies.

Cubital tunnel: Normal.

Radial tunnel: Normal.

Bones: Tiny intraosseous ganglion capitellum, less prominent prior
exam.
IMPRESSION: 1. Minimal nonspecific joint effusion.
2. Otherwise, essentially normal MRI of the right elbow.
Specifically, the radial tunnel and common extensor tendon appear
normal.
